# Patient Record
Sex: Male | Born: 1950 | Race: Black or African American | Hispanic: No | Marital: Single | Smoking: Former smoker
Health system: Southern US, Community
[De-identification: ages and names within clinical notes are randomized; demographics above are authoritative.]

## PROBLEM LIST (undated history)

## (undated) ENCOUNTER — Emergency Department: Payer: Self-pay | Source: Home / Self Care

## (undated) DIAGNOSIS — I10 Essential (primary) hypertension: Secondary | ICD-10-CM

## (undated) DIAGNOSIS — N289 Disorder of kidney and ureter, unspecified: Secondary | ICD-10-CM

## (undated) DIAGNOSIS — F0390 Unspecified dementia without behavioral disturbance: Secondary | ICD-10-CM

---

## 1998-01-03 ENCOUNTER — Emergency Department (HOSPITAL_COMMUNITY): Admission: EM | Admit: 1998-01-03 | Discharge: 1998-01-03 | Payer: Self-pay | Admitting: Emergency Medicine

## 1998-02-08 ENCOUNTER — Emergency Department (HOSPITAL_COMMUNITY): Admission: EM | Admit: 1998-02-08 | Discharge: 1998-02-08 | Payer: Self-pay | Admitting: Emergency Medicine

## 1998-04-25 ENCOUNTER — Emergency Department (HOSPITAL_COMMUNITY): Admission: EM | Admit: 1998-04-25 | Discharge: 1998-04-25 | Payer: Self-pay | Admitting: Emergency Medicine

## 1998-07-06 ENCOUNTER — Emergency Department (HOSPITAL_COMMUNITY): Admission: EM | Admit: 1998-07-06 | Discharge: 1998-07-06 | Payer: Self-pay | Admitting: Emergency Medicine

## 1998-08-01 ENCOUNTER — Emergency Department (HOSPITAL_COMMUNITY): Admission: EM | Admit: 1998-08-01 | Discharge: 1998-08-01 | Payer: Self-pay | Admitting: Emergency Medicine

## 1998-09-14 ENCOUNTER — Emergency Department (HOSPITAL_COMMUNITY): Admission: EM | Admit: 1998-09-14 | Discharge: 1998-09-14 | Payer: Self-pay | Admitting: Emergency Medicine

## 1998-10-17 ENCOUNTER — Emergency Department (HOSPITAL_COMMUNITY): Admission: EM | Admit: 1998-10-17 | Discharge: 1998-10-17 | Payer: Self-pay | Admitting: Emergency Medicine

## 1998-10-30 ENCOUNTER — Emergency Department (HOSPITAL_COMMUNITY): Admission: EM | Admit: 1998-10-30 | Discharge: 1998-10-30 | Payer: Self-pay | Admitting: Emergency Medicine

## 1998-11-14 ENCOUNTER — Emergency Department (HOSPITAL_COMMUNITY): Admission: EM | Admit: 1998-11-14 | Discharge: 1998-11-14 | Payer: Self-pay | Admitting: Emergency Medicine

## 1998-12-19 ENCOUNTER — Emergency Department (HOSPITAL_COMMUNITY): Admission: EM | Admit: 1998-12-19 | Discharge: 1998-12-19 | Payer: Self-pay | Admitting: *Deleted

## 1998-12-27 ENCOUNTER — Emergency Department (HOSPITAL_COMMUNITY): Admission: EM | Admit: 1998-12-27 | Discharge: 1998-12-27 | Payer: Self-pay | Admitting: Emergency Medicine

## 1998-12-27 ENCOUNTER — Encounter: Payer: Self-pay | Admitting: Emergency Medicine

## 1999-02-05 ENCOUNTER — Emergency Department (HOSPITAL_COMMUNITY): Admission: EM | Admit: 1999-02-05 | Discharge: 1999-02-05 | Payer: Self-pay | Admitting: Emergency Medicine

## 1999-05-04 ENCOUNTER — Encounter: Payer: Self-pay | Admitting: *Deleted

## 1999-05-04 ENCOUNTER — Emergency Department (HOSPITAL_COMMUNITY): Admission: EM | Admit: 1999-05-04 | Discharge: 1999-05-04 | Payer: Self-pay | Admitting: Emergency Medicine

## 1999-05-22 ENCOUNTER — Encounter: Payer: Self-pay | Admitting: Emergency Medicine

## 1999-05-22 ENCOUNTER — Emergency Department (HOSPITAL_COMMUNITY): Admission: EM | Admit: 1999-05-22 | Discharge: 1999-05-22 | Payer: Self-pay | Admitting: Emergency Medicine

## 1999-07-09 ENCOUNTER — Emergency Department (HOSPITAL_COMMUNITY): Admission: EM | Admit: 1999-07-09 | Discharge: 1999-07-09 | Payer: Self-pay | Admitting: Emergency Medicine

## 1999-08-18 ENCOUNTER — Encounter: Payer: Self-pay | Admitting: Emergency Medicine

## 1999-08-18 ENCOUNTER — Emergency Department (HOSPITAL_COMMUNITY): Admission: EM | Admit: 1999-08-18 | Discharge: 1999-08-18 | Payer: Self-pay | Admitting: Emergency Medicine

## 1999-09-11 ENCOUNTER — Emergency Department (HOSPITAL_COMMUNITY): Admission: EM | Admit: 1999-09-11 | Discharge: 1999-09-11 | Payer: Self-pay | Admitting: Emergency Medicine

## 1999-10-30 ENCOUNTER — Emergency Department (HOSPITAL_COMMUNITY): Admission: EM | Admit: 1999-10-30 | Discharge: 1999-10-30 | Payer: Self-pay | Admitting: *Deleted

## 1999-11-07 ENCOUNTER — Emergency Department (HOSPITAL_COMMUNITY): Admission: EM | Admit: 1999-11-07 | Discharge: 1999-11-07 | Payer: Self-pay | Admitting: Emergency Medicine

## 1999-11-26 ENCOUNTER — Encounter: Payer: Self-pay | Admitting: Internal Medicine

## 1999-11-26 ENCOUNTER — Inpatient Hospital Stay (HOSPITAL_COMMUNITY): Admission: EM | Admit: 1999-11-26 | Discharge: 1999-11-27 | Payer: Self-pay | Admitting: Emergency Medicine

## 1999-12-31 ENCOUNTER — Encounter: Payer: Self-pay | Admitting: Emergency Medicine

## 1999-12-31 ENCOUNTER — Emergency Department (HOSPITAL_COMMUNITY): Admission: EM | Admit: 1999-12-31 | Discharge: 1999-12-31 | Payer: Self-pay | Admitting: Emergency Medicine

## 2000-03-17 ENCOUNTER — Emergency Department (HOSPITAL_COMMUNITY): Admission: EM | Admit: 2000-03-17 | Discharge: 2000-03-17 | Payer: Self-pay | Admitting: Emergency Medicine

## 2000-04-28 ENCOUNTER — Emergency Department (HOSPITAL_COMMUNITY): Admission: EM | Admit: 2000-04-28 | Discharge: 2000-04-28 | Payer: Self-pay | Admitting: Emergency Medicine

## 2000-05-06 ENCOUNTER — Encounter: Payer: Self-pay | Admitting: Emergency Medicine

## 2000-05-06 ENCOUNTER — Emergency Department (HOSPITAL_COMMUNITY): Admission: EM | Admit: 2000-05-06 | Discharge: 2000-05-06 | Payer: Self-pay | Admitting: Emergency Medicine

## 2000-06-03 ENCOUNTER — Encounter: Payer: Self-pay | Admitting: *Deleted

## 2000-06-03 ENCOUNTER — Emergency Department (HOSPITAL_COMMUNITY): Admission: EM | Admit: 2000-06-03 | Discharge: 2000-06-03 | Payer: Self-pay | Admitting: *Deleted

## 2000-07-08 ENCOUNTER — Emergency Department (HOSPITAL_COMMUNITY): Admission: EM | Admit: 2000-07-08 | Discharge: 2000-07-08 | Payer: Self-pay | Admitting: Emergency Medicine

## 2000-07-19 ENCOUNTER — Emergency Department (HOSPITAL_COMMUNITY): Admission: EM | Admit: 2000-07-19 | Discharge: 2000-07-19 | Payer: Self-pay | Admitting: Emergency Medicine

## 2000-07-28 ENCOUNTER — Emergency Department (HOSPITAL_COMMUNITY): Admission: EM | Admit: 2000-07-28 | Discharge: 2000-07-28 | Payer: Self-pay | Admitting: Emergency Medicine

## 2000-07-28 ENCOUNTER — Encounter: Payer: Self-pay | Admitting: Emergency Medicine

## 2000-08-28 ENCOUNTER — Encounter: Payer: Self-pay | Admitting: Emergency Medicine

## 2000-08-28 ENCOUNTER — Inpatient Hospital Stay (HOSPITAL_COMMUNITY): Admission: EM | Admit: 2000-08-28 | Discharge: 2000-08-29 | Payer: Self-pay | Admitting: Emergency Medicine

## 2000-09-14 ENCOUNTER — Emergency Department (HOSPITAL_COMMUNITY): Admission: EM | Admit: 2000-09-14 | Discharge: 2000-09-14 | Payer: Self-pay | Admitting: Emergency Medicine

## 2000-09-15 ENCOUNTER — Emergency Department (HOSPITAL_COMMUNITY): Admission: EM | Admit: 2000-09-15 | Discharge: 2000-09-15 | Payer: Self-pay | Admitting: Emergency Medicine

## 2000-12-16 ENCOUNTER — Emergency Department (HOSPITAL_COMMUNITY): Admission: EM | Admit: 2000-12-16 | Discharge: 2000-12-16 | Payer: Self-pay | Admitting: Emergency Medicine

## 2001-01-16 ENCOUNTER — Emergency Department (HOSPITAL_COMMUNITY): Admission: EM | Admit: 2001-01-16 | Discharge: 2001-01-16 | Payer: Self-pay | Admitting: Emergency Medicine

## 2001-03-18 ENCOUNTER — Emergency Department (HOSPITAL_COMMUNITY): Admission: EM | Admit: 2001-03-18 | Discharge: 2001-03-18 | Payer: Self-pay | Admitting: Emergency Medicine

## 2001-05-23 ENCOUNTER — Encounter: Payer: Self-pay | Admitting: Emergency Medicine

## 2001-05-23 ENCOUNTER — Emergency Department (HOSPITAL_COMMUNITY): Admission: EM | Admit: 2001-05-23 | Discharge: 2001-05-23 | Payer: Self-pay | Admitting: Emergency Medicine

## 2001-05-28 ENCOUNTER — Emergency Department (HOSPITAL_COMMUNITY): Admission: EM | Admit: 2001-05-28 | Discharge: 2001-05-28 | Payer: Self-pay | Admitting: Emergency Medicine

## 2001-07-18 ENCOUNTER — Emergency Department (HOSPITAL_COMMUNITY): Admission: EM | Admit: 2001-07-18 | Discharge: 2001-07-18 | Payer: Self-pay | Admitting: Emergency Medicine

## 2001-08-01 ENCOUNTER — Emergency Department (HOSPITAL_COMMUNITY): Admission: EM | Admit: 2001-08-01 | Discharge: 2001-08-01 | Payer: Self-pay | Admitting: Emergency Medicine

## 2001-09-17 ENCOUNTER — Encounter: Payer: Self-pay | Admitting: Emergency Medicine

## 2001-09-17 ENCOUNTER — Emergency Department (HOSPITAL_COMMUNITY): Admission: EM | Admit: 2001-09-17 | Discharge: 2001-09-17 | Payer: Self-pay | Admitting: Emergency Medicine

## 2001-11-19 ENCOUNTER — Emergency Department (HOSPITAL_COMMUNITY): Admission: EM | Admit: 2001-11-19 | Discharge: 2001-11-19 | Payer: Self-pay | Admitting: Emergency Medicine

## 2001-11-19 ENCOUNTER — Encounter: Payer: Self-pay | Admitting: Emergency Medicine

## 2001-11-28 ENCOUNTER — Emergency Department (HOSPITAL_COMMUNITY): Admission: EM | Admit: 2001-11-28 | Discharge: 2001-11-28 | Payer: Self-pay | Admitting: Emergency Medicine

## 2001-12-23 ENCOUNTER — Emergency Department (HOSPITAL_COMMUNITY): Admission: EM | Admit: 2001-12-23 | Discharge: 2001-12-23 | Payer: Self-pay | Admitting: Emergency Medicine

## 2002-01-14 ENCOUNTER — Emergency Department (HOSPITAL_COMMUNITY): Admission: EM | Admit: 2002-01-14 | Discharge: 2002-01-14 | Payer: Self-pay

## 2002-02-13 ENCOUNTER — Emergency Department (HOSPITAL_COMMUNITY): Admission: EM | Admit: 2002-02-13 | Discharge: 2002-02-14 | Payer: Self-pay | Admitting: Emergency Medicine

## 2002-03-21 ENCOUNTER — Emergency Department (HOSPITAL_COMMUNITY): Admission: EM | Admit: 2002-03-21 | Discharge: 2002-03-21 | Payer: Self-pay

## 2002-07-22 ENCOUNTER — Emergency Department (HOSPITAL_COMMUNITY): Admission: EM | Admit: 2002-07-22 | Discharge: 2002-07-22 | Payer: Self-pay

## 2002-08-13 ENCOUNTER — Encounter: Payer: Self-pay | Admitting: Emergency Medicine

## 2002-08-13 ENCOUNTER — Emergency Department (HOSPITAL_COMMUNITY): Admission: EM | Admit: 2002-08-13 | Discharge: 2002-08-13 | Payer: Self-pay | Admitting: Emergency Medicine

## 2002-08-21 ENCOUNTER — Emergency Department (HOSPITAL_COMMUNITY): Admission: EM | Admit: 2002-08-21 | Discharge: 2002-08-21 | Payer: Self-pay | Admitting: Emergency Medicine

## 2002-08-29 ENCOUNTER — Emergency Department (HOSPITAL_COMMUNITY): Admission: EM | Admit: 2002-08-29 | Discharge: 2002-08-29 | Payer: Self-pay | Admitting: Emergency Medicine

## 2002-09-22 ENCOUNTER — Encounter: Payer: Self-pay | Admitting: Emergency Medicine

## 2002-09-22 ENCOUNTER — Emergency Department (HOSPITAL_COMMUNITY): Admission: EM | Admit: 2002-09-22 | Discharge: 2002-09-22 | Payer: Self-pay | Admitting: Emergency Medicine

## 2002-10-20 ENCOUNTER — Emergency Department (HOSPITAL_COMMUNITY): Admission: EM | Admit: 2002-10-20 | Discharge: 2002-10-20 | Payer: Self-pay | Admitting: Emergency Medicine

## 2002-11-20 ENCOUNTER — Emergency Department (HOSPITAL_COMMUNITY): Admission: EM | Admit: 2002-11-20 | Discharge: 2002-11-20 | Payer: Self-pay

## 2003-03-07 ENCOUNTER — Emergency Department (HOSPITAL_COMMUNITY): Admission: EM | Admit: 2003-03-07 | Discharge: 2003-03-07 | Payer: Self-pay | Admitting: Emergency Medicine

## 2003-03-07 ENCOUNTER — Encounter: Payer: Self-pay | Admitting: Emergency Medicine

## 2003-03-29 ENCOUNTER — Emergency Department (HOSPITAL_COMMUNITY): Admission: EM | Admit: 2003-03-29 | Discharge: 2003-03-29 | Payer: Self-pay | Admitting: Emergency Medicine

## 2003-05-24 ENCOUNTER — Emergency Department (HOSPITAL_COMMUNITY): Admission: EM | Admit: 2003-05-24 | Discharge: 2003-05-24 | Payer: Self-pay | Admitting: Emergency Medicine

## 2003-06-05 ENCOUNTER — Emergency Department (HOSPITAL_COMMUNITY): Admission: AD | Admit: 2003-06-05 | Discharge: 2003-06-05 | Payer: Self-pay | Admitting: Emergency Medicine

## 2003-06-05 ENCOUNTER — Emergency Department (HOSPITAL_COMMUNITY): Admission: EM | Admit: 2003-06-05 | Discharge: 2003-06-06 | Payer: Self-pay

## 2003-11-01 ENCOUNTER — Emergency Department (HOSPITAL_COMMUNITY): Admission: EM | Admit: 2003-11-01 | Discharge: 2003-11-02 | Payer: Self-pay | Admitting: Emergency Medicine

## 2003-11-24 ENCOUNTER — Emergency Department (HOSPITAL_COMMUNITY): Admission: EM | Admit: 2003-11-24 | Discharge: 2003-11-24 | Payer: Self-pay | Admitting: Emergency Medicine

## 2003-12-02 ENCOUNTER — Emergency Department (HOSPITAL_COMMUNITY): Admission: EM | Admit: 2003-12-02 | Discharge: 2003-12-02 | Payer: Self-pay | Admitting: Emergency Medicine

## 2003-12-29 ENCOUNTER — Emergency Department (HOSPITAL_COMMUNITY): Admission: EM | Admit: 2003-12-29 | Discharge: 2003-12-29 | Payer: Self-pay | Admitting: Emergency Medicine

## 2004-01-22 ENCOUNTER — Emergency Department (HOSPITAL_COMMUNITY): Admission: EM | Admit: 2004-01-22 | Discharge: 2004-01-22 | Payer: Self-pay | Admitting: Emergency Medicine

## 2004-01-31 ENCOUNTER — Emergency Department (HOSPITAL_COMMUNITY): Admission: EM | Admit: 2004-01-31 | Discharge: 2004-01-31 | Payer: Self-pay | Admitting: Emergency Medicine

## 2004-02-20 ENCOUNTER — Emergency Department (HOSPITAL_COMMUNITY): Admission: EM | Admit: 2004-02-20 | Discharge: 2004-02-20 | Payer: Self-pay | Admitting: Emergency Medicine

## 2004-03-25 ENCOUNTER — Emergency Department (HOSPITAL_COMMUNITY): Admission: EM | Admit: 2004-03-25 | Discharge: 2004-03-25 | Payer: Self-pay

## 2004-04-16 ENCOUNTER — Emergency Department (HOSPITAL_COMMUNITY): Admission: EM | Admit: 2004-04-16 | Discharge: 2004-04-16 | Payer: Self-pay | Admitting: Emergency Medicine

## 2004-06-11 ENCOUNTER — Emergency Department (HOSPITAL_COMMUNITY): Admission: EM | Admit: 2004-06-11 | Discharge: 2004-06-11 | Payer: Self-pay | Admitting: Emergency Medicine

## 2004-08-09 ENCOUNTER — Emergency Department (HOSPITAL_COMMUNITY): Admission: EM | Admit: 2004-08-09 | Discharge: 2004-08-09 | Payer: Self-pay | Admitting: Emergency Medicine

## 2004-09-09 ENCOUNTER — Emergency Department (HOSPITAL_COMMUNITY): Admission: EM | Admit: 2004-09-09 | Discharge: 2004-09-09 | Payer: Self-pay | Admitting: Emergency Medicine

## 2004-09-17 ENCOUNTER — Emergency Department (HOSPITAL_COMMUNITY): Admission: EM | Admit: 2004-09-17 | Discharge: 2004-09-17 | Payer: Self-pay | Admitting: Emergency Medicine

## 2004-10-08 ENCOUNTER — Emergency Department (HOSPITAL_COMMUNITY): Admission: EM | Admit: 2004-10-08 | Discharge: 2004-10-08 | Payer: Self-pay | Admitting: Emergency Medicine

## 2004-10-30 ENCOUNTER — Ambulatory Visit: Payer: Self-pay | Admitting: Internal Medicine

## 2004-10-30 ENCOUNTER — Inpatient Hospital Stay (HOSPITAL_COMMUNITY): Admission: EM | Admit: 2004-10-30 | Discharge: 2004-11-02 | Payer: Self-pay | Admitting: Emergency Medicine

## 2004-11-18 ENCOUNTER — Emergency Department (HOSPITAL_COMMUNITY): Admission: EM | Admit: 2004-11-18 | Discharge: 2004-11-18 | Payer: Self-pay | Admitting: Emergency Medicine

## 2005-03-11 ENCOUNTER — Emergency Department (HOSPITAL_COMMUNITY): Admission: EM | Admit: 2005-03-11 | Discharge: 2005-03-11 | Payer: Self-pay | Admitting: Emergency Medicine

## 2005-03-12 ENCOUNTER — Inpatient Hospital Stay (HOSPITAL_COMMUNITY): Admission: EM | Admit: 2005-03-12 | Discharge: 2005-03-15 | Payer: Self-pay | Admitting: Emergency Medicine

## 2005-04-14 ENCOUNTER — Emergency Department (HOSPITAL_COMMUNITY): Admission: EM | Admit: 2005-04-14 | Discharge: 2005-04-14 | Payer: Self-pay | Admitting: Emergency Medicine

## 2005-05-04 ENCOUNTER — Emergency Department (HOSPITAL_COMMUNITY): Admission: EM | Admit: 2005-05-04 | Discharge: 2005-05-04 | Payer: Self-pay | Admitting: Emergency Medicine

## 2005-08-01 ENCOUNTER — Emergency Department (HOSPITAL_COMMUNITY): Admission: EM | Admit: 2005-08-01 | Discharge: 2005-08-01 | Payer: Self-pay | Admitting: Emergency Medicine

## 2005-08-11 ENCOUNTER — Emergency Department (HOSPITAL_COMMUNITY): Admission: EM | Admit: 2005-08-11 | Discharge: 2005-08-11 | Payer: Self-pay | Admitting: Emergency Medicine

## 2005-09-11 ENCOUNTER — Emergency Department (HOSPITAL_COMMUNITY): Admission: EM | Admit: 2005-09-11 | Discharge: 2005-09-12 | Payer: Self-pay | Admitting: Emergency Medicine

## 2005-10-05 ENCOUNTER — Emergency Department (HOSPITAL_COMMUNITY): Admission: EM | Admit: 2005-10-05 | Discharge: 2005-10-05 | Payer: Self-pay | Admitting: *Deleted

## 2005-12-03 ENCOUNTER — Emergency Department (HOSPITAL_COMMUNITY): Admission: EM | Admit: 2005-12-03 | Discharge: 2005-12-03 | Payer: Self-pay | Admitting: Emergency Medicine

## 2006-04-18 ENCOUNTER — Emergency Department (HOSPITAL_COMMUNITY): Admission: EM | Admit: 2006-04-18 | Discharge: 2006-04-18 | Payer: Self-pay | Admitting: Emergency Medicine

## 2006-06-03 ENCOUNTER — Emergency Department (HOSPITAL_COMMUNITY): Admission: EM | Admit: 2006-06-03 | Discharge: 2006-06-04 | Payer: Self-pay | Admitting: Emergency Medicine

## 2006-06-17 ENCOUNTER — Emergency Department (HOSPITAL_COMMUNITY): Admission: EM | Admit: 2006-06-17 | Discharge: 2006-06-17 | Payer: Self-pay | Admitting: Emergency Medicine

## 2006-07-13 ENCOUNTER — Emergency Department (HOSPITAL_COMMUNITY): Admission: EM | Admit: 2006-07-13 | Discharge: 2006-07-13 | Payer: Self-pay | Admitting: Emergency Medicine

## 2006-07-23 ENCOUNTER — Emergency Department (HOSPITAL_COMMUNITY): Admission: EM | Admit: 2006-07-23 | Discharge: 2006-07-24 | Payer: Self-pay | Admitting: Emergency Medicine

## 2006-10-18 ENCOUNTER — Emergency Department (HOSPITAL_COMMUNITY): Admission: EM | Admit: 2006-10-18 | Discharge: 2006-10-18 | Payer: Self-pay | Admitting: Anesthesiology

## 2006-11-09 ENCOUNTER — Inpatient Hospital Stay (HOSPITAL_COMMUNITY): Admission: EM | Admit: 2006-11-09 | Discharge: 2006-11-11 | Payer: Self-pay | Admitting: Emergency Medicine

## 2006-11-30 ENCOUNTER — Emergency Department (HOSPITAL_COMMUNITY): Admission: EM | Admit: 2006-11-30 | Discharge: 2006-11-30 | Payer: Self-pay | Admitting: Emergency Medicine

## 2006-12-11 ENCOUNTER — Emergency Department (HOSPITAL_COMMUNITY): Admission: EM | Admit: 2006-12-11 | Discharge: 2006-12-11 | Payer: Self-pay | Admitting: Emergency Medicine

## 2006-12-20 ENCOUNTER — Emergency Department (HOSPITAL_COMMUNITY): Admission: EM | Admit: 2006-12-20 | Discharge: 2006-12-20 | Payer: Self-pay | Admitting: Emergency Medicine

## 2007-01-18 ENCOUNTER — Emergency Department (HOSPITAL_COMMUNITY): Admission: EM | Admit: 2007-01-18 | Discharge: 2007-01-18 | Payer: Self-pay | Admitting: Emergency Medicine

## 2007-03-09 ENCOUNTER — Emergency Department (HOSPITAL_COMMUNITY): Admission: EM | Admit: 2007-03-09 | Discharge: 2007-03-09 | Payer: Self-pay | Admitting: Emergency Medicine

## 2007-06-23 ENCOUNTER — Emergency Department (HOSPITAL_COMMUNITY): Admission: EM | Admit: 2007-06-23 | Discharge: 2007-06-23 | Payer: Self-pay | Admitting: Emergency Medicine

## 2007-07-02 ENCOUNTER — Inpatient Hospital Stay (HOSPITAL_COMMUNITY): Admission: EM | Admit: 2007-07-02 | Discharge: 2007-07-23 | Payer: Self-pay | Admitting: Emergency Medicine

## 2008-10-12 IMAGING — CT CT HEAD W/O CM
1 of 2 series · 13 of 30 positions shown, 17 images · IV contrast (agent unspecified)
Comparison: 07/03/07.

CLINICAL DATA: Follow-up intracranial hemorrhage. 
HEAD CT WITHOUT CONTRAST:
TECHNIQUE: Contiguous axial images were obtained from the base of the skull through the vertex according to standard protocol without contrast.

[Series 2: brain · axial · 0.47mm/px · z∈[+101,+231]mm · 13 of 32 slices shown, 17 images]
[im 3/32  brain]
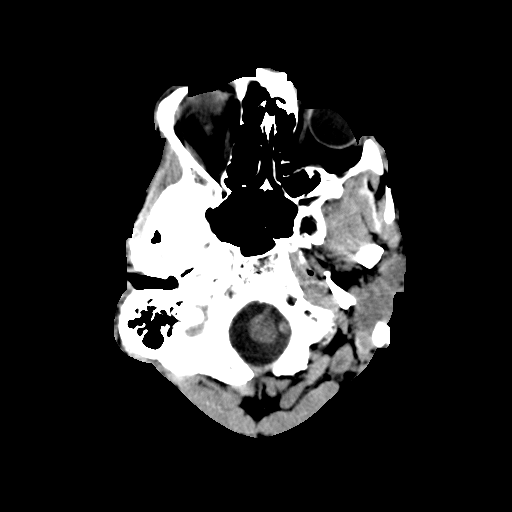
[im 3/32  bone]
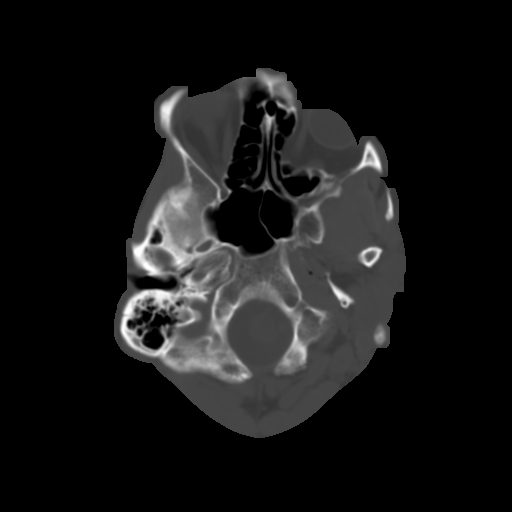
[im 5/32  brain]
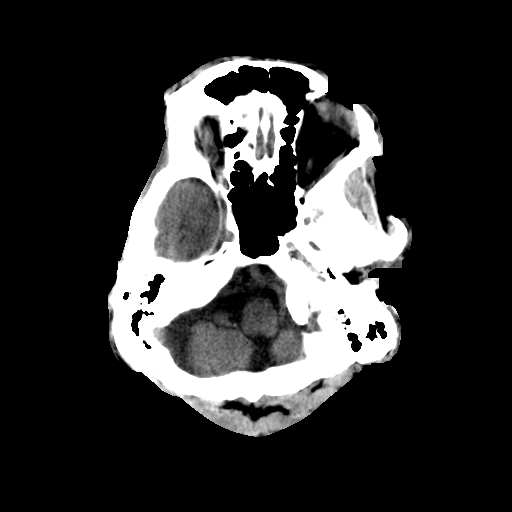
[im 7/32  brain]
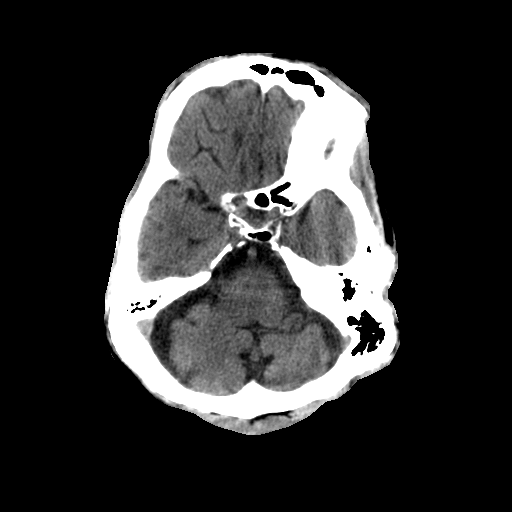
[im 9/32  brain]
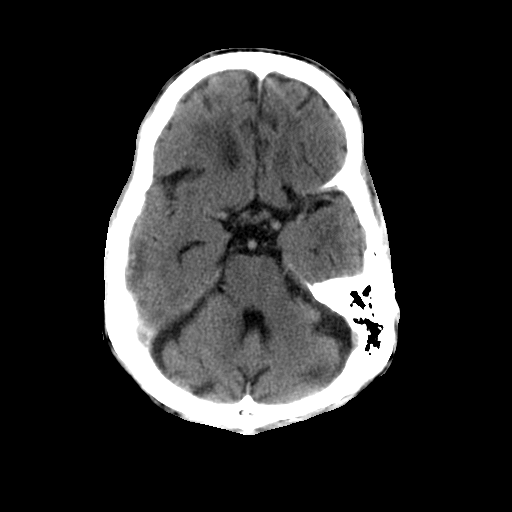
[im 12/32  brain]
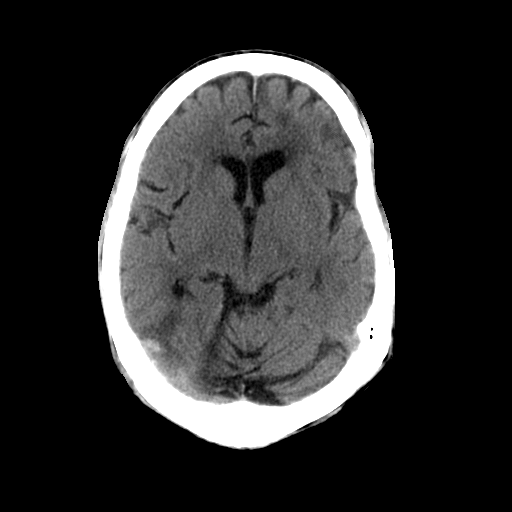
[im 12/32  bone]
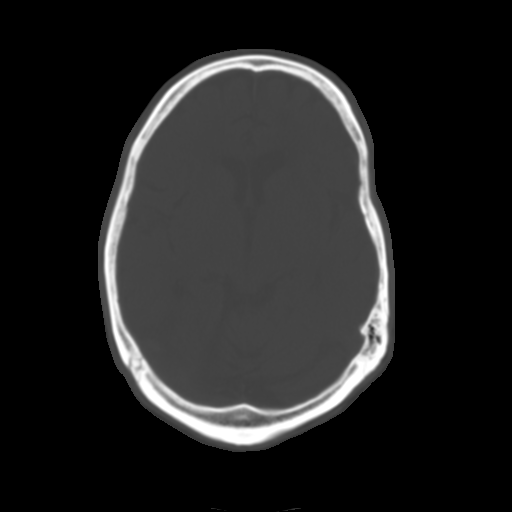
[im 14/32  brain]
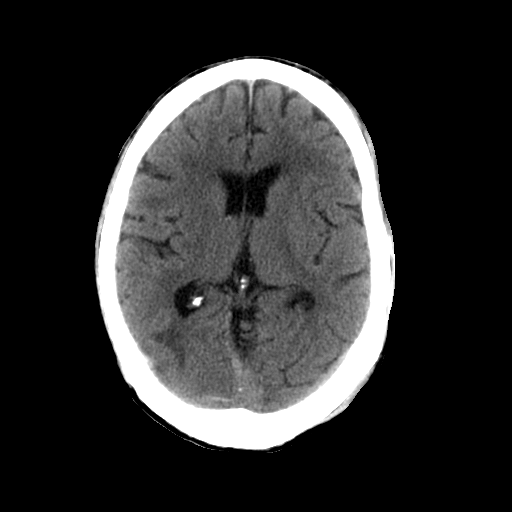
[im 16/32  brain]
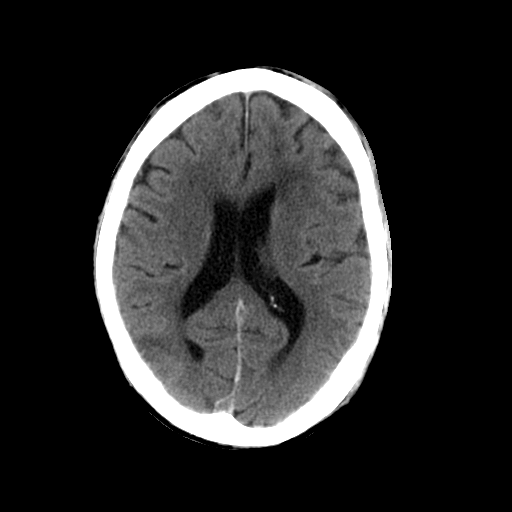
[im 18/32  brain]
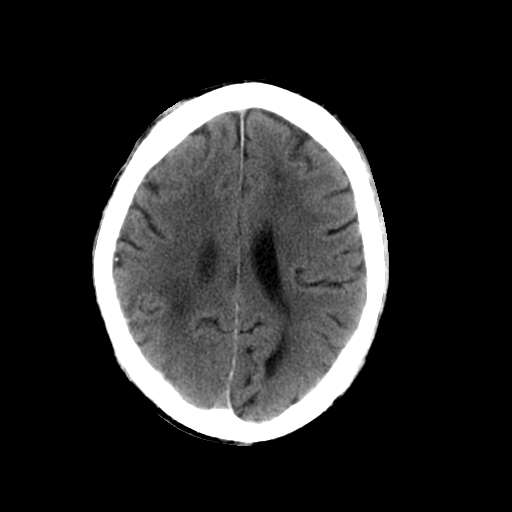
[im 20/32  brain]
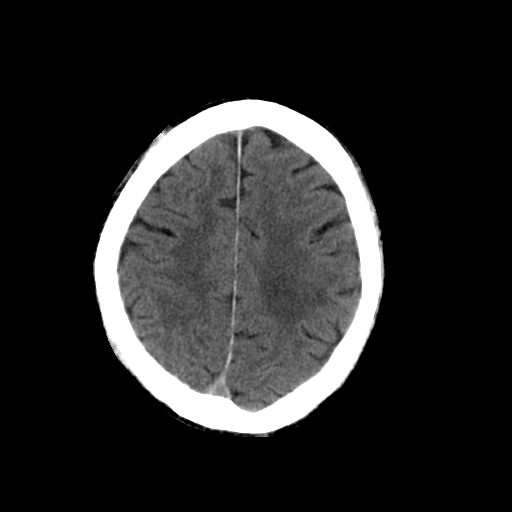
[im 20/32  bone]
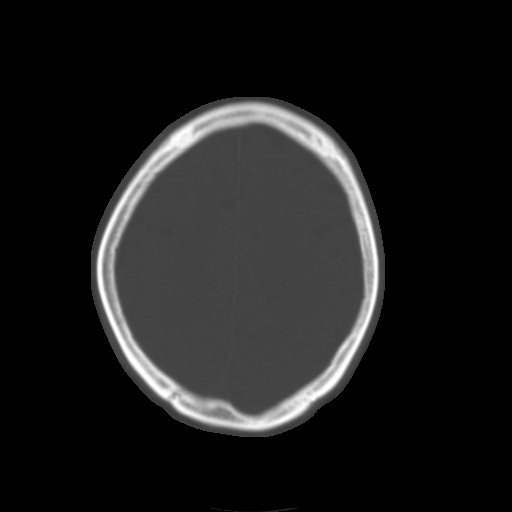
[im 23/32  brain]
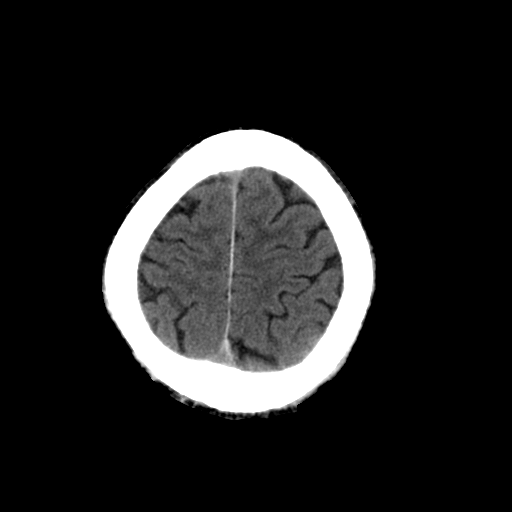
[im 25/32  brain]
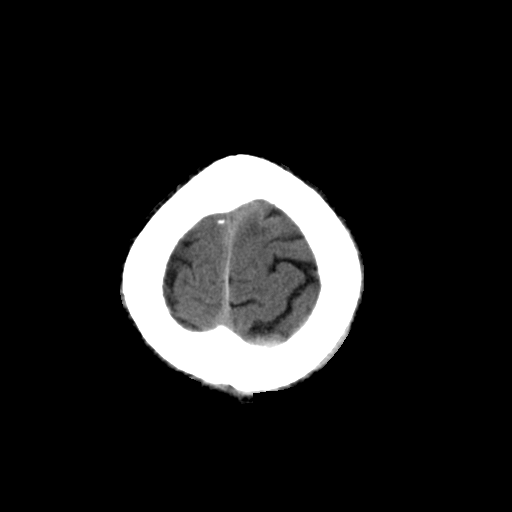
[im 27/32  brain]
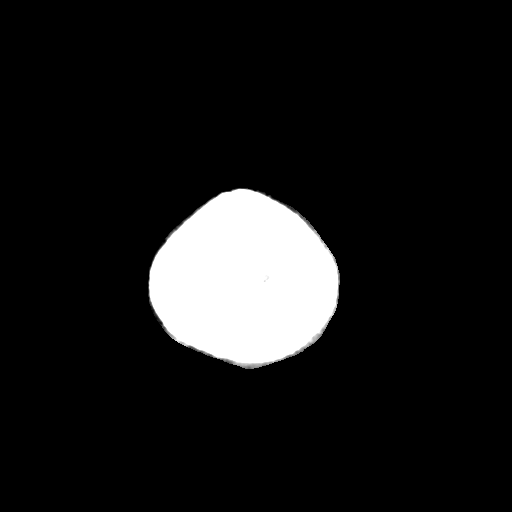
[im 29/32  brain]
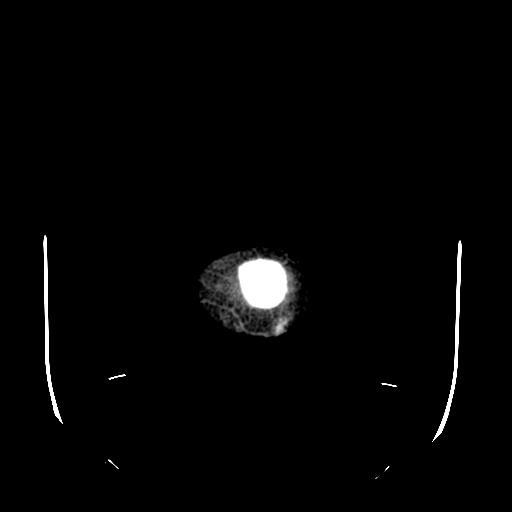
[im 29/32  bone]
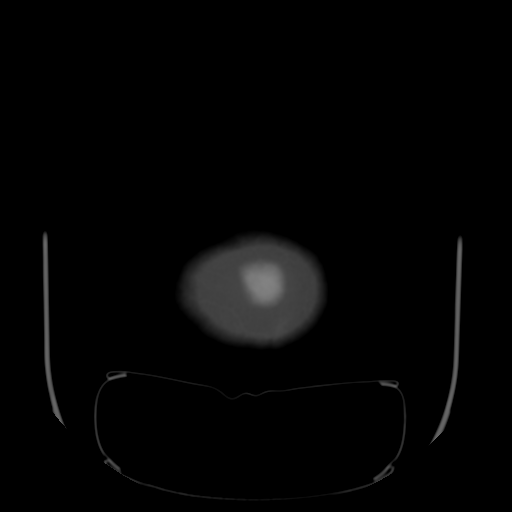

[13 of 30 positions shown; findings below may reference images not displayed]

FINDINGS: The hematoma seen along the junction of the posterior right temporal, occipital, and parietal lobe with surrounding edema has overall similar dimensions to that on prior exam.  There is also small amount of residual subarachnoid within the right parietal region.  The left superior parietal subdural hematoma is visualized although less conspicuous than on the prior exam.  Calvarial fracture extends from high left parietal region across midline towards the right lambdoid suture.  Atrophy and small vessel disease type changes remain.  Old left zygomatic arch fracture.
IMPRESSION: The left superior parietal subdural hematoma is less well delineated on the present exam and may have partially resolved with the remainder of findings similar to that on prior exam.

## 2008-10-14 IMAGING — CR DG CERVICAL SPINE FLEX&EXT ONLY
3 series · 3 of 3 positions shown · non-contrast
Comparison: CT cervical spine 07/02/07.

CLINICAL DATA: 56-year-old male with history of trauma, subarachnoid hemorrhage, status-post fall. 
 CERVICAL SPINE FLEXION AND EXTENSION SERIES ? 3 VIEW:

[w c-spine lat * (1 of 2)]
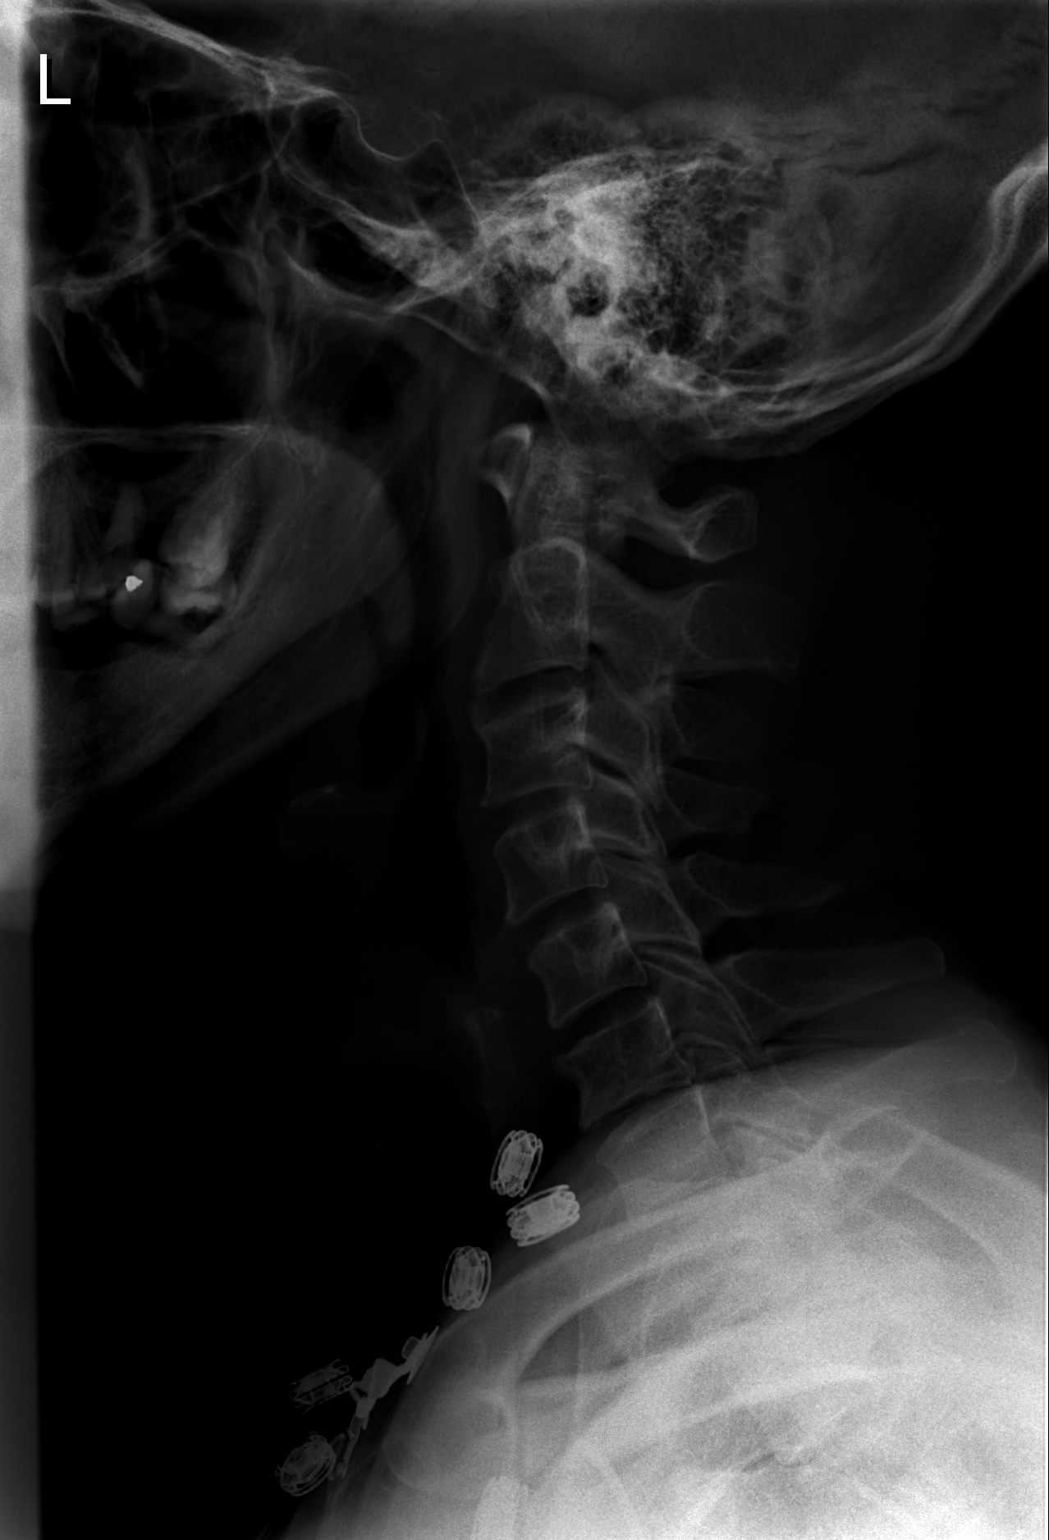

[w c-spine lat * (2 of 2)]
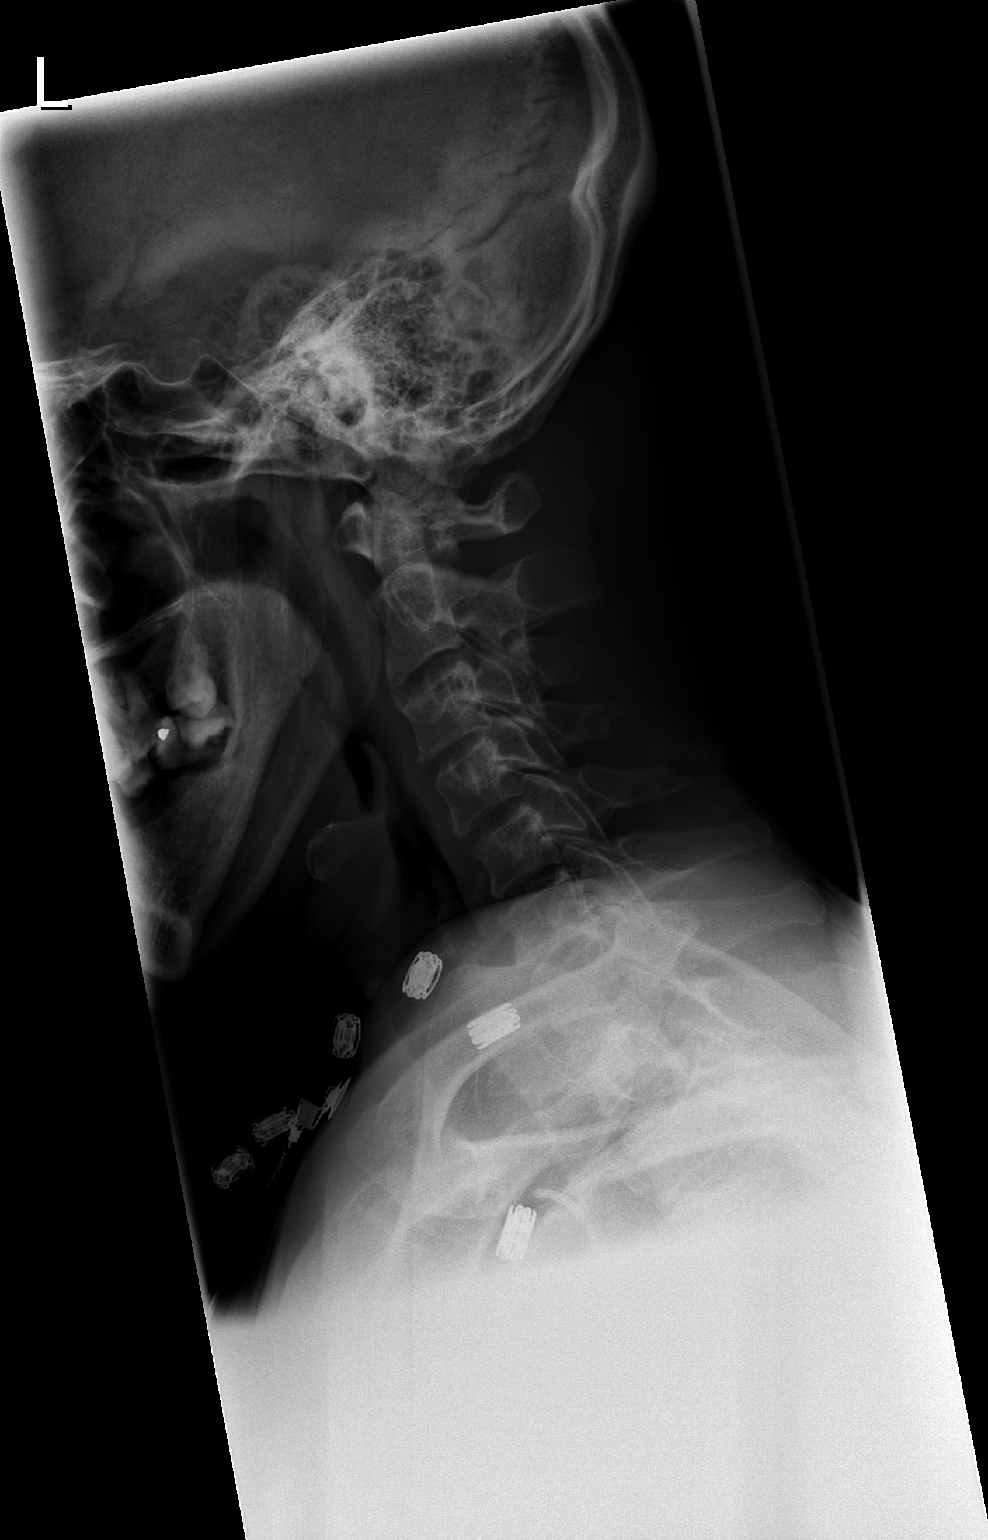

[w c-spine lat]
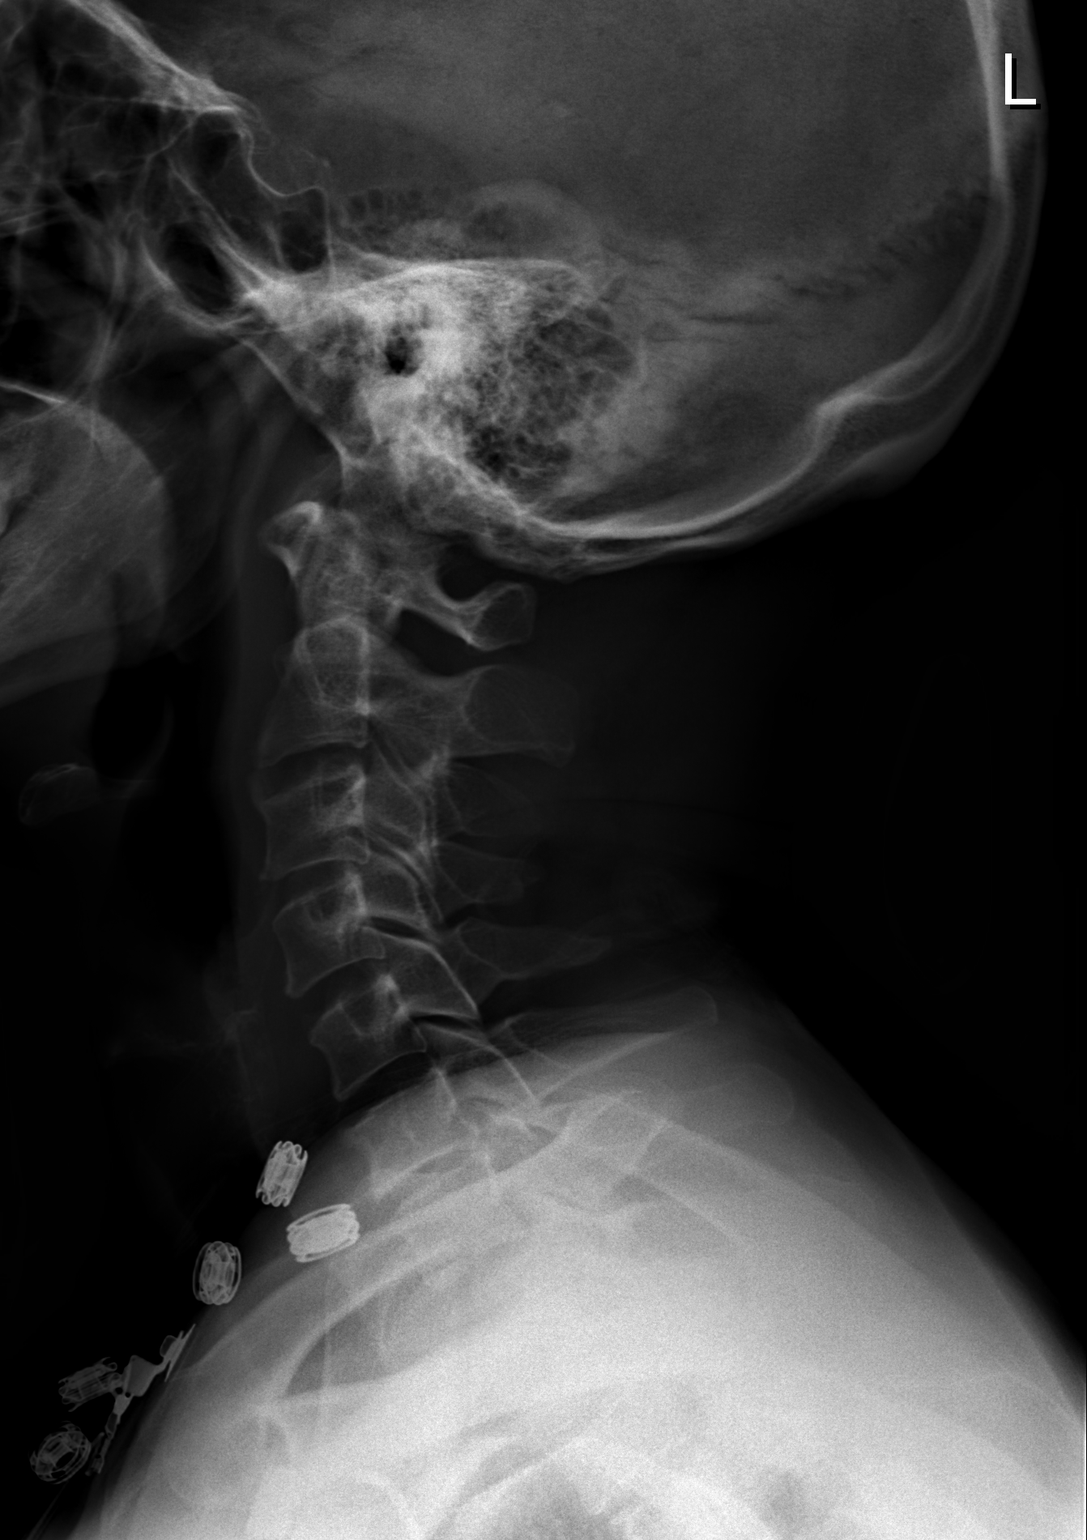

[3 of 3 positions shown; findings below may reference images not displayed]

FINDINGS: The neutral view demonstrates grade I retrolisthesis of C-3 on C-4 and C-3 end plate spurring, stable from prior CT.  Incidental ossification of the posterior longitudinal ligament at the C-3 level was demonstrated on that study.  There is otherwise straightening of cervical lordosis.  Cervicothoracic junction alignment appears normal.  Prevertebral soft tissues are within normal limits.  With flexion there is limited range of motion.  There is maintained alignment at the cervicothoracic junction.  The C3-C4 spondylolisthesis is slightly less pronounced.  No abnormal motion is identified.  With extension, there is also decreased range of motion.  Stable C3-C4 spondylolisthesis and cervical thoracic junction alignment.
IMPRESSION: 1.  Limited range of motion in both flexion and extension. 
 2.  Stable grade I retrolisthesis of C-3 on C-4. 
 3.  No translational instability identified.

## 2009-02-21 ENCOUNTER — Emergency Department (HOSPITAL_BASED_OUTPATIENT_CLINIC_OR_DEPARTMENT_OTHER): Admission: EM | Admit: 2009-02-21 | Discharge: 2009-02-21 | Payer: Self-pay | Admitting: Emergency Medicine

## 2009-04-19 ENCOUNTER — Emergency Department (HOSPITAL_COMMUNITY): Admission: EM | Admit: 2009-04-19 | Discharge: 2009-04-19 | Payer: Self-pay | Admitting: Emergency Medicine

## 2009-05-09 ENCOUNTER — Inpatient Hospital Stay (HOSPITAL_COMMUNITY): Admission: EM | Admit: 2009-05-09 | Discharge: 2009-05-13 | Payer: Self-pay | Admitting: Emergency Medicine

## 2010-12-23 LAB — PHENYTOIN LEVEL, TOTAL
Phenytoin Lvl: 24 ug/mL — ABNORMAL HIGH (ref 10.0–20.0)
Phenytoin Lvl: 24.4 ug/mL — ABNORMAL HIGH (ref 10.0–20.0)

## 2010-12-23 LAB — COMPREHENSIVE METABOLIC PANEL
CO2: 17 mEq/L — ABNORMAL LOW (ref 19–32)
Calcium: 9.4 mg/dL (ref 8.4–10.5)
Chloride: 110 mEq/L (ref 96–112)
GFR calc non Af Amer: >60 mL/min (ref >60)
Potassium: 3.2 mEq/L — ABNORMAL LOW (ref 3.5–5.1)
Sodium: 143 mEq/L (ref 135–145)

## 2010-12-23 LAB — BASIC METABOLIC PANEL
BUN: 7 mg/dL (ref 6–23)
Chloride: 107 mEq/L (ref 96–112)
Creatinine, Ser: 1.07 mg/dL (ref 0.4–1.5)
Glucose, Bld: 95 mg/dL (ref 70–99)

## 2010-12-23 LAB — TSH: TSH: 1.067 u[IU]/mL (ref 0.350–4.500)

## 2010-12-23 LAB — CBC
HCT: 34.2 % — ABNORMAL LOW (ref 39.0–52.0)
HCT: 35 % — ABNORMAL LOW (ref 39.0–52.0)
Hemoglobin: 11.2 g/dL — ABNORMAL LOW (ref 13.0–17.0)
Hemoglobin: 11.7 g/dL — ABNORMAL LOW (ref 13.0–17.0)
MCHC: 32.5 g/dL (ref 30.0–36.0)
MCHC: 32.6 g/dL (ref 30.0–36.0)
MCHC: 32.8 g/dL (ref 30.0–36.0)
MCV: 96.8 fL (ref 78.0–100.0)
MCV: 97.6 fL (ref 78.0–100.0)
Platelets: 127 10*3/uL — ABNORMAL LOW (ref 150–400)
Platelets: 130 10*3/uL — ABNORMAL LOW (ref 150–400)
RBC: 3.53 MIL/uL — ABNORMAL LOW (ref 4.22–5.81)
RBC: 3.59 MIL/uL — ABNORMAL LOW (ref 4.22–5.81)
RDW: 17.2 % — ABNORMAL HIGH (ref 11.5–15.5)
RDW: 17.2 % — ABNORMAL HIGH (ref 11.5–15.5)
RDW: 17.7 % — ABNORMAL HIGH (ref 11.5–15.5)
WBC: 10.9 10*3/uL — ABNORMAL HIGH (ref 4.0–10.5)
WBC: 7.9 10*3/uL (ref 4.0–10.5)

## 2010-12-23 LAB — DIFFERENTIAL
Basophils Absolute: 0 10*3/uL (ref 0.0–0.1)
Basophils Relative: 0 % (ref 0–1)
Eosinophils Relative: 2 % (ref 0–5)
Lymphocytes Relative: 30 % (ref 12–46)
Lymphs Abs: 3.3 10*3/uL (ref 0.7–4.0)
Monocytes Relative: 7 % (ref 3–12)
Neutrophils Relative %: 62 % (ref 43–77)

## 2010-12-23 LAB — RAPID URINE DRUG SCREEN, HOSP PERFORMED
Amphetamines: NOT DETECTED
Barbiturates: NOT DETECTED
Benzodiazepines: NOT DETECTED
Cocaine: NOT DETECTED
Opiates: NOT DETECTED
Tetrahydrocannabinol: NOT DETECTED

## 2010-12-23 LAB — ETHANOL: Alcohol, Ethyl (B): <5 mg/dL (ref 0–10)

## 2010-12-24 LAB — URINALYSIS, ROUTINE W REFLEX MICROSCOPIC
Bilirubin Urine: NEGATIVE
Ketones, ur: NEGATIVE mg/dL
Nitrite: NEGATIVE
Protein, ur: NEGATIVE mg/dL

## 2010-12-24 LAB — COMPREHENSIVE METABOLIC PANEL
ALT: 14 U/L (ref 0–53)
AST: 26 U/L (ref 0–37)
Alkaline Phosphatase: 85 U/L (ref 39–117)
CO2: 24 mEq/L (ref 19–32)
Calcium: 8.5 mg/dL (ref 8.4–10.5)
GFR calc Af Amer: >60 mL/min (ref >60)
Glucose, Bld: 118 mg/dL — ABNORMAL HIGH (ref 70–99)
Potassium: 3.8 mEq/L (ref 3.5–5.1)
Sodium: 140 mEq/L (ref 135–145)
Total Protein: 6.1 g/dL (ref 6.0–8.3)

## 2010-12-24 LAB — DIFFERENTIAL
Basophils Relative: 0 % (ref 0–1)
Eosinophils Absolute: 0.1 10*3/uL (ref 0.0–0.7)
Eosinophils Relative: 1 % (ref 0–5)
Lymphs Abs: 0.9 10*3/uL (ref 0.7–4.0)
Monocytes Relative: 5 % (ref 3–12)
Neutrophils Relative %: 73 % (ref 43–77)

## 2010-12-24 LAB — TRICYCLICS SCREEN, URINE: TCA Scrn: NOT DETECTED

## 2010-12-24 LAB — CBC
Hemoglobin: 10.4 g/dL — ABNORMAL LOW (ref 13.0–17.0)
MCHC: 34.1 g/dL (ref 30.0–36.0)
RBC: 3.27 MIL/uL — ABNORMAL LOW (ref 4.22–5.81)
RDW: 15.5 % (ref 11.5–15.5)

## 2010-12-24 LAB — RAPID URINE DRUG SCREEN, HOSP PERFORMED: Tetrahydrocannabinol: NOT DETECTED

## 2010-12-24 LAB — ETHANOL: Alcohol, Ethyl (B): <5 mg/dL (ref 0–10)

## 2010-12-25 LAB — DIFFERENTIAL
Basophils Absolute: 0 10*3/uL (ref 0.0–0.1)
Basophils Relative: 1 % (ref 0–1)
Eosinophils Absolute: 0 10*3/uL (ref 0.0–0.7)
Monocytes Relative: 9 % (ref 3–12)
Neutro Abs: 5 10*3/uL (ref 1.7–7.7)
Neutrophils Relative %: 63 % (ref 43–77)

## 2010-12-25 LAB — CBC
MCHC: 34.3 g/dL (ref 30.0–36.0)
MCV: 92.6 fL (ref 78.0–100.0)
Platelets: 121 10*3/uL — ABNORMAL LOW (ref 150–400)
RDW: 13.6 % (ref 11.5–15.5)

## 2010-12-25 LAB — BASIC METABOLIC PANEL
BUN: 14 mg/dL (ref 6–23)
CO2: 28 mEq/L (ref 19–32)
Calcium: 9.8 mg/dL (ref 8.4–10.5)
Chloride: 107 mEq/L (ref 96–112)
Creatinine, Ser: 1.1 mg/dL (ref 0.4–1.5)
Glucose, Bld: 115 mg/dL — ABNORMAL HIGH (ref 70–99)

## 2010-12-25 LAB — URINE CULTURE
Colony Count: NO GROWTH
Culture: NO GROWTH

## 2010-12-25 LAB — URINALYSIS, ROUTINE W REFLEX MICROSCOPIC
Hgb urine dipstick: NEGATIVE
Nitrite: NEGATIVE
Protein, ur: 100 mg/dL — AB
Urobilinogen, UA: 1 mg/dL (ref 0.0–1.0)

## 2010-12-25 LAB — URINE MICROSCOPIC-ADD ON

## 2011-01-30 NOTE — Discharge Summary (Signed)
Alex Waters, Alex Waters NO.:  [id number]   MEDICAL RECORD NO.:  [id number]          PATIENT TYPE:  INP   LOCATION:  3027                         FACILITY:  [facility]   PHYSICIAN:  Ladell Pier, M.D.   DATE OF BIRTH:  July 14, 1951   DATE OF ADMISSION:  07/02/2007  DATE OF DISCHARGE:                               DISCHARGE SUMMARY   ADDENDUM:   This is for the period of July 15, 2007 until July 20, 2007.  The  patient really had no significant change within that time period.  He is  basically here waiting for placement.  He has chronic hyponatremia, and  that has been stable.  With the last CMP done on July 17, 2007, his  sodium was 121.  It has been running between 118 and 120-121.  Cortisol  level has been normal at 15.3.  Dilantin level was 22.7 on July 15, 2007, and that should be redone within 7 days of July 15, 2007.  Otherwise the patient has been stable, with no new events.  He is  basically waiting for placement.      Ladell Pier, M.D.  Electronically Signed     NJ/MEDQ  D:  07/20/2007  T:  07/21/2007  Job:  [id number]

## 2011-01-30 NOTE — Discharge Summary (Signed)
Alex Waters, Alex Waters                ACCOUNT NO.:  [id number]   MEDICAL RECORD NO.:  [id number]          PATIENT TYPE:  INP   LOCATION:  3027                         FACILITY:  [facility]   PHYSICIAN:  Ladell Pier, M.D.   DATE OF BIRTH:  Dec 16, 1950   DATE OF ADMISSION:  07/02/2007  DATE OF DISCHARGE:                               DISCHARGE SUMMARY   ADDENDUM:   MEDICATIONS:  No changes from the second to today:   1. Nicotine patch, Nicoderm CQ 7 mg q.24h. daily.  2. Protonix 40 mg daily.  3. Thiamine 100 mg daily.  4. Amlodipine 10 mg daily.  5. Folic acid 1 mg daily.  6. Clonidine 0.1 mg twice daily.  7. Demeclocycline 300 mg three times daily.  8. Lotensin 20 mg daily.  9. Dilantin 100 mg at bedtime.      Ladell Pier, M.D.  Electronically Signed     NJ/MEDQ  D:  07/22/2007  T:  07/22/2007  Job:  [id number]

## 2011-01-30 NOTE — Consult Note (Signed)
NAMEOTONIEL, MYHAND NO.:  [id number]   MEDICAL RECORD NO.:  [id number]          PATIENT TYPE:  INP   LOCATION:  1401                         FACILITY:  [facility]   PHYSICIAN:  Antonietta Breach, M.D.  DATE OF BIRTH:  December 27, 1950   DATE OF CONSULTATION:  05/12/2009  DATE OF DISCHARGE:                                 CONSULTATION   Mr. Whalley has continued with difficulty with orientation.  He also has  impairment in his ability to appreciate the necessary environmental  support for him.  His mood is within normal limits.  Also his appetite  is intact.  He is not having any hallucinations or delusions.  He is non-  combative and is cooperative with care.   LABORATORY DATA:  WBC 6.7, hemoglobin 11.7, platelet count 127,000.   EXAMINATION:  VITAL SIGNS:  Temperature 98.2, pulse 60, respiratory rate  18, blood pressure 118/78, O2 saturation room air 100%.   MENTAL STATUS EXAM:  Mr. Schuld is lying in a right lateral decubitus  position in his hospital bed with no abnormal involuntary movements.  His eye contact is poor.  However, his attention span is only mildly  decreased.  His affect is mildly flat.  His mood is within normal  limits.  His concentration is mildly impaired on orientation testing.  He believes that the year is 2002.  He is oriented to person.  He  believes that he is in a rest home when he is actually in [facility]   His memory is impaired.  His fund of knowledge and intelligence are  below that of his estimated pre morbid baseline.  His speech does  involve normal rate.  There is no dysarthria.  The prosody is mildly  flat.  Thought process is coherent.  Thought content no thoughts of  harming himself or others no delusions or hallucinations.  His insight  is poor.  His judgment is impaired.   ASSESSMENT:  AXIS I:  1. 293.00 delirium not otherwise specified.  He did have a delirium      superimposed on his dementia, which appears to  have been      multifactorial, possibly involving a thiamine deficiency component      as well as a postictal component.  2. Dementia not otherwise specified.  3. Mr. Chauvin decapacitating diagnosis is dementia not otherwise      specified.   RECOMMENDATIONS:  Would continue his thiamine 100 mg daily indefinitely.      Antonietta Breach, M.D.  Electronically Signed     JW/MEDQ  D:  05/12/2009  T:  05/12/2009  Job:  [id number]

## 2011-01-30 NOTE — Group Therapy Note (Signed)
Alex Waters, Alex Waters                ACCOUNT NO.:  [id number]   MEDICAL RECORD NO.:  [id number]          PATIENT TYPE:  INP   LOCATION:  1401                         FACILITY:  [facility]   PHYSICIAN:  Marcellus Scott, MD     DATE OF BIRTH:  1951/03/05                                 PROGRESS NOTE   DATE OF DISCHARGE:  To be determined.   PRIMARY MEDICAL DOCTOR:  Unassigned.   DISCHARGE DIAGNOSIS:  1. Seizure disorder.  2. Alcohol abuse.  3. Encephalopathy or delirium, improving.  4. Anemia.  5. Thrombocytopenia.  6. Hypertension.  7. Medication noncompliance.  8. Dementia.  9. Homelessness.  10.Tobacco abuse.   DISCHARGE MEDICATIONS:  1. Protonix 40 mg p.o. daily.  2. Folic acid 1 mg p.o. daily.  3. Thiamine 100 mg p.o. daily, indefinitely.  4. Nicotine 21 mg per 24-hour patch, 1 daily for 5 weeks then taper.  5. Dilantin 200 mg p.o. nightly.  Recommend repeating Dilantin levels      and albumin in 5 days and adjust dosage appropriately.  6. Albuterol 2.5 mg nebulizations inhaled q.6 hours p.r.n.  7. Tylenol 650 mg p.o. q.4 hourly p.r.n.  8. Ativan 0.5 to 1 mg p.o. q.6 hourly p.r.n. for anxiety or agitation.   PROCEDURES:  CT of the head without contrast.  Impression:  a.  Recently sustained right orbital fractures - stable.  b.  Left frontal scalp hematoma.  c.  No acute or focal intracranial abnormality.   PERTINENT LABS:  TSH 1.067, serum Dilantin 24.4.  CBCs hemoglobin 11.7, hematocrit 35.6,  white blood cell 6.7, platelets 127,000.  Basic metabolic panel within  normal limits with BUN 7, creatinine 1.07.  Urine drug screen negative.  Blood alcohol level less than 5.  Hepatic panel on August 23  unremarkable except for AST of 38.   CONSULTATIONS:  Psychiatry, Dr. Jeanie Sewer.   HOSPITAL COURSE AND PATIENT DISPOSITION:  Alex Waters is a 60 year old African American homeless male patient with  history of seizure disorder who is noncompliant to his medications.  [employer] Social [employer] has guardianship.  He apparently left AMA  after a recent hospitalization prior to transfer to a secure assisted  living facility in [facility].  He was now found in a car  apparently in a postictal state.  He was brought to the emergency room  for further evaluation and management.  In the emergency room he was  noted to have generalized tonic-clonic seizures.  He was loaded with 1  gram of Dilantin and 500 mg of Keppra and was admitted for further  evaluation and management.   PROBLEM LIST:  1. Seizure disorder.  The patient has had recurrent seizures secondary      to noncompliance with his medications.  His Dilantin level is supra-      therapeutic but this is after a load.  Pharmacy recommended      reducing his dose to 200 nightly and repeating his Dilantin level      in 5-7 days and adjusted appropriately.  The same is being done.  Will discontinue Keppra.  As long as he is compliant to Dilantin,      seizures should be controlled.  2. Alcohol abuse.  The patient has not demonstrated any features of      alcohol withdrawal.  He was placed on thiamine, multivitamins and      Ativan p.r.n.  3. Encephalopathy or delirium.  This is probably secondary to his      postictal state as well as history of dementia and alcohol abuse      and thiamine deficiency.  Dr. Jeanie Sewer first saw him on August 24      and indicated the patient lacks capacity to leave against medical      advice and recommended continuing thiamine indefinitely.  Yesterday      he was awake, alert and much more cooperative with his care.  Even      this morning when Dr. Jeanie Sewer saw him he was probably more      cooperative.  Dr. Jeanie Sewer indicated that he had delirium      superimposed on dementia which appears to be multifactorial,      possibly involving thymine deficiency component as well as      postictal component.  He indicated that Alex Waters decapacitating       diagnosis was his dementia.  Arrangements were made for the patient      to go to [facility]      tomorrow, May 13, 2009.  His guardian, Mr. Sunnie Nielsen, phone      number [phone], was contacted and he plans to take the patient to      that facility.  However, in the interim the patient became agitated      and indicated that he wanted to leave the hospital.  The nursing      staff and myself spoke with the patient.  The patient is awake,      alert, oriented only to self and place, not oriented to time and      indicates this is 2001 at one time and 2002 at other.  He indicates      he has no place to go and no source for food or medications.  When      asked why he wants to leave he indicates he just wants to leave.      He was made aware about the risks of leaving against medical advise      including and not limited to seizures and even death as a      consequence of same.  We do not believe the patient has the      capacity to understand this information and make a proper judgment.      However, on subsequently talking to him, the patient seems to have      calmed down and agreeable to stay overnight.  I have paged to      discuss his case further with Dr. Jeanie Sewer.  Will monitor him      overnight and consider discharging him to the assisted living      facility in      the morning.  Recommend repeating his Dilantin level and 5-7 days.      Obviously the patient should not drive until cleared by an M.D.      Marcellus Scott, MD  Electronically Signed     AH/MEDQ  D:  05/12/2009  T:  05/12/2009  Job:  [id number]  cc:   Antonietta Breach, M.D.   [facility]

## 2011-01-30 NOTE — Consult Note (Signed)
Alex Waters, Alex Waters NO.:  [id number]   MEDICAL RECORD NO.:  [id number]          PATIENT TYPE:  INP   LOCATION:  3027                         FACILITY:  [facility]   PHYSICIAN:  Antonietta Breach, M.D.  DATE OF BIRTH:  07-Jan-1951   DATE OF CONSULTATION:  07/09/2007  DATE OF DISCHARGE:                                 CONSULTATION   This one is marked priority because of the need to utilize the dictation  in facilitating discharge to a placement facility.   REQUESTING PHYSICIAN:  [facility].   REASON FOR CONSULTATION:  Mental status changes, assess capacity.   HISTORY OF PRESENT ILLNESS:  [name] is a 60 year old male  admitted to the [facility] on October 15 after a seizure and a  concussion as well as an intracerebral hemorrhage.   The patient also suffered rhabdomyolysis. Alex Waters continues with  impaired judgment, impaired reasoning and difficulty with memory. He is  not expressing any depressed mood, he has no thoughts of harming himself  or others. He does make illogical statements of confabulation. He shows  no hallucinations or delusions.   MENTAL STATUS EXAM:  Alex Waters is alert. He is oriented to self. He is  lying in a supine position in his hospital bed. His memory is impaired.  His speech shows slightly flat prosody. There is no dysarthria, affect  is flat, mood is within normal limits. Thought process involves some  illogia and confabulation. Thought content, no thoughts of harming  himself or others, no hallucinations or delusions. Judgment is impaired,  insight is poor.   ASSESSMENT:  294.9, unspecified persistent mental disorder not otherwise  specified. The patient appears to have a multifactorial dementia.   The patient demonstrates critical impairments in judgment, reasoning and  appreciation of his general medical and physical disabilities as well as  appreciation of the environmental and medical support that are  required  to sustain him.   Alex Waters does not the capacity for informed consent.      Antonietta Breach, M.D.  Electronically Signed     JW/MEDQ  D:  07/11/2007  T:  07/11/2007  Job:  [id number]

## 2011-01-30 NOTE — Consult Note (Signed)
NAMEROBERTSON, Alex Waters NO.:  [id number]   MEDICAL RECORD NO.:  [id number]          PATIENT TYPE:  INP   LOCATION:  3113                         FACILITY:  [facility]   PHYSICIAN:  Donalee Citrin, M.D.        DATE OF BIRTH:  January 06, 1951   DATE OF CONSULTATION:  07/02/2007  DATE OF DISCHARGE:                                 CONSULTATION   REASON FOR CONSULTATION:  Closed head injury, skull fracture, small  subdural hematoma and right occipital hemorrhagic contusion.   HISTORY OF PRESENT ILLNESS:  The patient is a 60 year old gentleman with  a known history of multiple medical problems to include a seizure  disorder, who lives homeless and also has a history of alcoholism and  was found under a bridge with altered mental status.  The patient  apparently had improved and was able to be conversant with EMS, however,  on arrival to the ED underwent another seizure and the patient has been  not responsive to commands while in the emergency department.   The patient's past medical history is otherwise none.  Surgical history  is also not available at this time, as well as medication history.   On exam, the patient is lethargic, does arouse to deep noxious  stimulation and withdraws symmetrically with 5/5 strength in all four  extremities; however, he will not follow commands.  Pupils are equal,  round, reactive to light.  Extraocular movements intact by roving eye  movements.  Does groan and vocalize to noxious stimuli.  TMs show with  the left TM not to show no sign of hemotympanum, and the right TM was  unable to be visualized.  The remainder of his cranial nerve exam  appears to be intact.  Strength appears to be 5/5 on withdrawal exam.  Reflexes appear to be brisk but symmetric.  Toes appear to be downgoing.  He does have rectal tone.   His laboratory on admission was remarkable for a CK of 587.  Sodium of  134, potassium 4.8, chloride 100, carbon dioxide 24, glucose 109,  BUN  and creatinine 10 and 0.95, respectively.  CBC has a white count of  13.9, hematocrit of 42.3, platelet count of 230.  A CT scan of his head  on admission shows a small right parietal skull fracture, also appears  to extend maybe even to the occipital suture.  There does appear to be a  small underlying skin subdural hematoma.  There is a small occipital 1-  cm hemorrhagic contusion with no mass effect.  He does have a fair  amount of cortical atrophy and sulci and gyri are widely patent and open  with no midline shift or no mass effect.   ASSESSMENT AND PLAN:  This is a 60 year old man with known seizure  disorder, homeless, with unknown other significant past history, who is  planning on being admitted to the hospital to the internal medicine  service for observation.  I have recommend serial neuro checks overnight  in the ICU or step-down unit with a repeat CT scan in about 12 hours.  With his seizure disorder, recommend treating him prophylactically with  antiepileptics, Dilantin or the like.  Consider consulting neurology for  seizure management.  Would also recommend putting him on the alcoholic  withdrawal cocktails.           ______________________________  Donalee Citrin, M.D.    GC/MEDQ  D:  07/02/2007  T:  07/03/2007  Job:  [id number]

## 2011-01-30 NOTE — H&P (Signed)
Alex Waters, Alex Waters NO.:  [id number]   MEDICAL RECORD NO.:  [id number]          PATIENT TYPE:  INP   LOCATION:  0102                         FACILITY:  [facility]   PHYSICIAN:  Theodosia Paling, MD    DATE OF BIRTH:  [birth year]   DATE OF ADMISSION:  05/09/2009  DATE OF DISCHARGE:                              HISTORY & PHYSICAL   PRIMARY CARE PHYSICIAN:  None.   CHIEF COMPLAINT:  Seizure.   HISTORY OF PRESENT ILLNESS:  Alex Waters is a 60 year old homeless  patient with a history of seizure disorder with a history of established  noncompliance who was found in a car apparently in a postictal state.  The patient was subsequently brought to the [facility]  emergency room  for further evaluation and management.  In the ER he was oriented x3.  However, after some time he started to have a generalized tonic-clonic  seizure.  He got loaded with Dilantin and [facility] hospitalist service was  contacted for further evaluation and management.   REVIEW OF SYSTEMS:  As per HPI, otherwise negative.   PAST MEDICAL HISTORY:  1. History of seizure disorder with noncompliance.  2. History of alcohol abuse.  3. History of tobacco abuse.   HOME MEDICATION:  The patient is supposed to be on Dilantin but he has  not taken Dilantin for a while according to him.   ALLERGIES:  HE IS DOCUMENTED AS HAVING ALLERGY TO PENICILLIN.  He denies  any allergies himself.   SOCIAL HISTORY:  The patient is homeless.  He smokes one pack in a week.  He has a history of alcohol abuse.  Denies any active intake at this  time.  Denies IV drugs, cocaine or marijuana.   FAMILY HISTORY:  Noncontributory.   EXAMINATION:  VITAL SIGNS:  Blood pressure 125/82, temperature 98.2,  afebrile.  Heart rate 76, respiratory rate 18, oxygen SATs 95% at 2  liters nasal cannula oxygen.  GENERAL EXAMINATION:  No acute cardiorespiratory distress.  HEENT:  EOM intact.  No ear, nose discharge.  LUNGS:  Normal breath  sounds.  No rhonchi or crepitations.  CARDIOVASCULAR:  S1, S2 normal.  No murmur or gallop heard.  GI:  Soft, nontender.  EXTREMITIES:  No pedal edema.  PSYCH:  Oriented x2.  CNS:  Speech intact and follows commands.   IMAGING:  CT scan of the head showing recent sustained right orbital  fracture which is stable, left frontal scalp hematoma.  No acute or  focal intracranial abnormality.   LABORATORY DATA:  WBC 10.9, hemoglobin 11.4, hematocrit 35.0, platelet  count 154, sodium 143, potassium 3.2, chloride 110, bicarbonate 17,  glucose 154, BUN 14, creatinine 1.19.  Alkaline phosphatase 90, AST 38,  ALT 17, total protein 6.9, albumin 2.7, calcium 9.4.  Urine tox screen  negative, alcohol less than 5.   ASSESSMENT AND PLAN:  1. Seizure disorder.  The patient is already loaded with IV Dilantin.      We will start him on maintenance dose of Dilantin.  I have also      started him on  IV Keppra.  He can be discharged on oral Dilantin      once his Dilantin level is therapeutic and he is seizure free for      next 1-2 days.  Initially he will receive it daily for 24 hours.      Neurologic checks will be performed.  Physical therapy,      occupational therapy consults are requested.  Social work consult      for placement given he is homeless.  He will be needing drug      assistance as well.  2. Tobacco abuse.  We will place him on nicotine patch.  3. Alcohol abuse.  The patient's blood alcohol level is normal.  Still      I put him on IV as needed Ativan.  4. Prophylaxis.  Deep vein thrombosis, gastrointestinal on board.  5. Code status.  The patient is full code.  Total time spent in admission of this patient around 45 minutes.  The patient does not have a primary care Leida L[facility]on to which this  admission note can be forwarded.      Theodosia Paling, MD  Electronically Signed     NP/MEDQ  D:  05/09/2009  T:  05/09/2009  Job:  [id number]
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
## 2011-01-30 NOTE — Consult Note (Signed)
NAME:  Alex Waters, Alex Waters NO.:   MEDICAL RECORD NO.:  [id number]           PATIENT TYPE:   LOCATION:                                 FACILITY:   PHYSICIAN:  Antonietta Breach, M.D.       DATE OF BIRTH:   DATE OF CONSULTATION:  05/10/2009  DATE OF DISCHARGE:                                 CONSULTATION   ADDENDUM:   ALLERGIES:  1. NONSTEROIDAL ANTI-INFLAMMATORY DRUGS.  2. COX-2 INHIBITORS.  3. PENICILLIN.   MEDICATIONS:  His MAR is reviewed.  Alex Waters is back on his Dilantin.  He is receiving thiamine 100 mg daily.   Urine drug screen, negative.  Alcohol, negative.  Sodium 143, BUN 14,  creatinine 1.19, SGOT 38, SGPT 17.  WBC 10.9, hemoglobin 11.4, and  platelet count 154.  Head CT without contrast, recently sustained right  orbital fracture, stable left frontal scalp hematoma, no acute or focal  intracranial abnormality.   REVIEW OF SYSTEMS:  Alex Waters cannot provide, this is gleaned from the  medical record and staff.   CONSTITUTIONAL:  Head, eyes, ears, nose, throat, mouth, neurologic,  psychiatric, cardiovascular, respiratory, gastrointestinal,  genitourinary, skin, musculoskeletal, hematologic, lymphatic, endocrine,  and metabolic, all unremarkable.   PHYSICAL EXAMINATION:  VITAL SIGNS:  Temperature 97.6, pulse 62,  respiratory rate 16, blood pressure 105/66, and O2 saturation room air  100%.  GENERAL APPEARANCE:  Alex Waters is an elderly male, lying in a supine  position in his hospital bed with no abnormal involuntary movements.   MENTAL STATUS EXAM:  Alex Waters has clouding of consciousness.  His  concentration is decreased.  His attention span is decreased.  Affect is  mildly anxious. Mood is mildly anxious.  On orientation testing, he  states that the year is 64, he does not know the day or the month.  On  memory testing, he names 3/3 objects immediately and 0/3 on recall.  His  fund of knowledge and intelligence are below that of his  estimated  premorbid baseline.  His speech is soft.  Thought process is coherent at  the time of the  examination.  Thought content, no thoughts of harming  himself or others.  No delusions or hallucinations.  His insight is  poor.  His ability to appreciate his risk of mortality and morbidity is  poor.  His judgment is impaired.   ASSESSMENT:  Axis I:  1. Delirium, 293.00, not otherwise specified.  2. Rule out underlying dementia, not otherwise specified.  3. History of alcohol dependence.  Axis II:  Deferred.  Axis III:  See past medical history.  Axis IV:  General medical.  Axis V:  30.   Alex Waters has critical deficits in memory function.  Judgment and the  ability to appreciate his risk of morbidity and mortality.  He does not  have the capacity to leave the hospital against medical advice.   He does meet the criteria for being held under the implied consent rule.   Given his current mental deficits, he requires 24-hour per day  supervision to avoid lethal, self-neglect.   Regarding treatment, it is difficult to determine whether he has a  chance of responding to thiamine given the limited history data.   Some cases have shown memory response after a number of weeks.   If he does continue with memory dysfunction, would ensure that a  reversible memory dysfunction etiology workup has been completed.   If the workup is negative, would consider starting Aricept 5 mg nightly  with caution regarding gastrointestinal side effects such as loose  stools.   If his cognitive and memory function does return, he will be a candidate  for the 12-step method.      Antonietta Breach, M.D.     JW/MEDQ  D:  03/21/2010  T:  03/22/2010  Job:  [id number]

## 2011-01-30 NOTE — Consult Note (Signed)
NAME:  Alex Waters, Alex Waters                     ACCOUNT NO.:   MEDICAL RECORD NO.:  [id number]           PATIENT TYPE:   LOCATION:                                 FACILITY:   PHYSICIAN:  Antonietta Breach, M.D.       DATE OF BIRTH:   DATE OF CONSULTATION:  05/10/2009  DATE OF DISCHARGE:                                 CONSULTATION   REQUESTING PHYSICIAN:  [facility].   REASON FOR CONSULTATION:  Acute mental status changes, trying to leave  the hospital against medical advice.   HISTORY OF PRESENT ILLNESS:  [name] is a 60 year old male  admitted to the [facility] on May 09, 2009, due to a  seizure disorder.  He does have a long-term history of a seizure  disorder with a pattern of noncompliance with his medication.  He has  developed approximately 3 days of progressive confusion, disorientation.  Thought process incoherent and thought process disorganization.  He also  has been displaying acute worsening of memory, poor judgment, and poor  appreciation of his risk of mortality and morbidity.  He has been  requesting to leave the hospital against medical advice.   He does have a history of alcohol abuse; however, it is unknown what his  alcohol pattern was just prior to admission and his seizure.   His mental status changes have not improved with his general medical  care for psychosocial efforts.   PAST PSYCHIATRIC HISTORY:  In review of the past medical record, Mr.  Alex Waters did have a period in the fall of 2008 where he was demonstrating  alogia and confabulation along with impaired judgment and poor insight.  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   PAST MEDICAL HISTORY:  History of seizure disorder with noncompliance.  He has not been taking his Dilantin.   ALLERGIES:  PENICILLIN.   FAMILY PSYCHIATRIC HISTORY:  None known.   SOCIAL HISTORY:  [name] is medically disabled and retired.  He is  homeless.  He denied  illegal drugs when first interviewed upon  admission.  His acute history of alcohol is unknown.   DICTATION ENDED AT THIS POINT.      Antonietta Breach, M.D.     JW/MEDQ  D:  03/21/2010  T:  03/22/2010  Job:  [id number]

## 2011-01-30 NOTE — Discharge Summary (Signed)
Alex Waters, Alex NO.:  Waters   MEDICAL RECORD NO.:  [id number]          PATIENT TYPE:  INP   LOCATION:  1508                         FACILITY:  [facility]   PHYSICIAN:  Marcellus Scott, MD     DATE OF BIRTH:  [birth year]   DATE OF ADMISSION:  05/09/2009  DATE OF DISCHARGE:  05/13/2009                               DISCHARGE SUMMARY   ADDENDUM:  This is an addendum to the discharge summary that was  dictated by Dr. Waymon Amato on May 06, 2009.   PRIMARY MEDICAL DOCTOR:  Unassigned.   Please refer to the discharge summary dictated by this dictator  yesterday for discharge diagnoses and discharge medications.   The only change in the discharge medications is :Ativan is changed to 1-  3 mg p.o. q. 3 h p.r.n. for agitation.  Hold for sedation.   DISCUSSION:  On May 12, 2009, the patient was transferred to the med  psych floor.  I discussed with Dr. Jeanie Sewer, who indicated that the  patient could be held in the hospital against his wishes, based on  implied consent for following two reasons.   1. The patient lacked capacity to understand the detrimental      consequences of his refusal to care, as well as leave against      medical advise.  He hence could not leave against medical advice.  2. The patient was in danger for lethal self-neglect.   Dr. Jeanie Sewer recommended Ativan 1-3 mg p.o. or IM q. 3 h p.r.n. and if  Ativan was ineffective, then he recommended Zyprexa 5 mg IM b.i.d.  p.r.n.  The patient, at this time, indicates he is all right and  denies any complaints.  He has not had any further seizures.  He has not  required any of the Ativan or Zyprexa for agitation.  At this time, his  physical examination is stable and the patient is stable to discharge to  the assisted living facility with his guardian, who is expected to pick  him up shortly.  I did discuss discharge plans with Dr. Jeanie Sewer and he  indicated the patient could be discharged to the  assisted living  facility.      Marcellus Scott, MD  Electronically Signed     AH/MEDQ  D:  05/13/2009  T:  05/13/2009  Job:  [id number]   cc:   Assisted Living Facility
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
## 2011-01-30 NOTE — Discharge Summary (Signed)
NAMESAWYER, MENTZER NO.:  [id number]   MEDICAL RECORD NO.:  [id number]          PATIENT TYPE:  INP   LOCATION:  3027                         FACILITY:  [facility]   PHYSICIAN:  Hettie Holstein, D.O.    DATE OF BIRTH:  May 31, 1951   DATE OF ADMISSION:  07/02/2007  DATE OF DISCHARGE:                               DISCHARGE SUMMARY   ADDENDUM:  Addendum to discharge summary as dictated by Dr. Isidor Holts on July 06, 2007, job number [id number].   For details of Mr. [facility] hospital course prior to July 11, 2007,  please refer to the summary of Dr. Brien Few summarizing those events.  In  addendum, since that time, Mr. [facility] hospital course has been that  essentially of medical stability.  He underwent psychiatric consultation  and was determined to lack the ability to understand or make decisions  about his medical care or to provide informed consent.  Due to no  available decision makers for Mr. Lipson after best efforts,  guardianship is being pursued.  Case workers are currently pursuing a 24-  hour supervised situation for Mr. Stilley who was formerly homeless.   ADDITIONAL DIAGNOSES:  1. 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  His urine osmolarity was 695, and he has been      placed on free water restriction at 900 mL per day in addition to      demeclocycline at three times a day dosing, 300 mg three times a      day.  His sodium has begun to come up a bit.  It is now 120.      Because his low sodium and upward trending potassium, we were      checking some cortisol levels.  2. Supratherapeutic Dilantin level.  This was on a dose of 300 mg by      mouth every evening.  Yesterday evenings dose  was held, and a      pharmacy is assisting in the final dose upon his transfer, though      it is anticipated that this will be at 200 mg by mouth every      evening once his levels fall into the normal therapeutic  range.   A final dictation addressing his medications on discharge will be  provided once his Dilantin levels fall into the normal therapeutic  ranges and he is found a suitable place for discharge.      Hettie Holstein, D.O.  Electronically Signed     ESS/MEDQ  D:  07/15/2007  T:  07/15/2007  Job:  [id number]

## 2011-02-02 NOTE — H&P (Signed)
Alex Waters, Alex Waters                ACCOUNT NO.:  [id number]   MEDICAL RECORD NO.:  [id number]          PATIENT TYPE:  INP   LOCATION:  3025                         FACILITY:  [facility]   PHYSICIAN:  Hind I Elsaid, MD      DATE OF BIRTH:  12/15/1950   DATE OF ADMISSION:  11/08/2006  DATE OF DISCHARGE:                              HISTORY & PHYSICAL   PRIMARY CARE PHYSICIAN:  Unassigned.   PRESENT COMPLAINT:  Altered mental status and seizure.   HISTORY OF PRESENT ILLNESS:  A 60 year old male with a known history of  alcoholism and seizure disorder on Dilantin, no complaint with the  Dilantin.  According to him last Dilantin has been taken one month ago.  Patient was homeless, found on the street status post fall by the  police, brought to the emergency room.  Patient developed seizure during  evaluation in the emergency room.  Patient seemed confused, disoriented  and unable to give a good history.  Patient at this time is currently  awake and disoriented but he knows which hospital he is in, but he  cannot answer questions appropriately.   PAST MEDICAL HISTORY:  1. According to the previous records is significant for alcohol abuse.  2. Tobacco abuse.  3. History of previous altered mental status stage.  4. History of seizure in the setting of alcohol withdrawal.  5. Medication noncompliance.   MEDICATIONS:  Patient on Dilantin 100 mg p.o. t.i.d.   SOCIAL HISTORY:  Seems to be patient is homeless, lives on the streets,  drinks alcohol and smokes a lot of cigarettes as he is asking for a  cigarette at this time.   ALLERGIES:  SEEM TO BE PENICILLIN.   FAMILY HISTORY:  Unobtainable.   REVIEW OF SYSTEMS:  Unreliable as the patient is not completely  oriented.   PHYSICAL EXAMINATION:  VITAL SIGNS:  Today his temperature is 98.7,  blood pressure one reading of 199/126 in the ER, 139/126, pulse rate of  103, respiratory rate of 20, saturating 97% on room air.  GENERAL:  The  patient is confused, awake, responding to questions but  inappropriately.  Not well groomed.  HEENT:  There is swelling in the left eyebrow.  There is some scar  evidence of a fall.  There is swelling of the left eyeball but there is  no hemorrhage on the eye itself.  When questioned him, he admitted he  had fallen on that side.  Also the patient has some erythema and  laceration on the frontal area, but there is no active bleeding at this  time.  Dry mucous membranes, poor dentition and hygiene.  Patient is  smelling really bad.  RESPIRATORY:  Good air entry bilaterally.  No wheezing or rales.  CARDIOVASCULAR:  Heart regular rate and rhythm.  ABDOMEN:  Soft, nontender with positive bowel sounds.  No edema,  cyanosis or clubbing.   LABORATORY DATA:  His blood workup shows bicarb 21.3, hemoglobin 16.7,  hematocrit 49, sodium 135, potassium 3.9, chloride 107, glucose 96, BUN  11.  Alcohol level was less than 0.5.  Dilantin level was less than 2.5,  CBC is 0.4, hemoglobin 14.4, hematocrit 44, platelets 182, sodium 135,  potassium 3.8, chloride 99, CO2 21, glucose 92, BUN 11, calcium 0.93.  CT head was done which was negative for bleed or other acute  intracranial bleeding, nonspecific white matter change.  Left frontal  scalp hematoma.  C-spine negative for fracture or other acute bone  abnormality.  Multilevel degenerative change as above.  Chest x-ray:  No  active lung disease.   ASSESSMENT AND PLAN:  Patient is a 60 year old male known to [place] with a  history of seizure disorder, alcoholic, homeless who presents at this  time with altered mental status with two seizures.  1. Seizure disorder.  Admit the patient to the neurology floor.  2. Altered mental status both related to alcohol withdrawal and also      possibly due to the seizures.  We will start the patient on IV      fluid hydration.  We will start him on his Dilantin as his Dilantin      level was subtherapeutic with folate  might be cause of this altered      mental status.   PROBLEM LIST:  1. Altered mental status.  2. Seizure disorder.  3. History of alcohol abuse with the possibility of alcohol      withdrawal.  4. Hypertension.   PLAN:  Admit to neurology.  Seizure precautions.  Start the patient on  Dilantin 100 mg p.o. t.i.d., Ativan 1 mg IV q.8 h. P.r.n. and folic acid  and saline.  Patient needs Artist for evaluation.  For high blood pressure we will start the patient on  hydrochlorothiazide, a small dose, with Norvasc 5 mg p.o. b.i.d.  Upon  reviewing the previous history, social worker being evaluated on the  care, patient refused any further placement in a shelter.  He apparently  also has refused application for Medicaid and disability at this time.  DVT and GI prophylaxis.      Hind Bosie Helper, MD     HIE/MEDQ  D:  11/09/2006  T:  11/10/2006  Job:  [id number]

## 2011-02-02 NOTE — H&P (Signed)
Alex Waters, Alex Waters                ACCOUNT NO.:  [id number]   MEDICAL RECORD NO.:  [id number]          PATIENT TYPE:  EMS   LOCATION:  ED                            FACILITY:  [facility]   PHYSICIAN:  Lonia Blood, M.D.      DATE OF BIRTH:  14-Aug-1951   DATE OF ADMISSION:  03/12/2005  DATE OF DISCHARGE:  LH                                HISTORY & PHYSICAL   PRIMARY CARE PHYSICIAN:  Unassigned.   PRESENTING COMPLAINT:  Altered mental status change and confusion.   HISTORY OF PRESENT ILLNESS:  The patient is a 60 year old with known history  of alcoholism, who was last admitted in  April 2006 in [place] with seizure disorder.  The patient left AMA on  November 02, 2004. He was found today by the [employer] wandering around  here in [place].  He seemed to be confused, disoriented, and unable to  give adequate history.  Hence, the sheriff deputy decided to bring him into  the hospital.  The patient is currently awake but disoriented.  He does not  know which hospital he is in.  Does not know the date or the time.  The  patient is asking questions such as why am I here?   PAST MEDICAL HISTORY:  1.  Alcohol abuse.  2.  Tobacco abuse.  3.  History of previous altered mental status change.  4.  History of seizure in the setting of alcohol withdrawal; thereby      possibly alcohol withdrawal seizure and not necessarily true seizure      disorder.  5  Medication noncompliance.   MEDICATIONS:  The patient is supposed to be on Dilantin 100 mg p.o. t.i.d.   SOCIAL HISTORY:  The patient is unable to give adequate history, but from  previous records, the patient apparently lives out in the street, drinks  alcohol, and smokes a lot of cigarettes as he is asking for a cigarette at  this time.   ALLERGIES:  Questionable allergy to PENICILLIN from a previous record.   FAMILY HISTORY:  Unobtainable at this point.   REVIEW OF SYSTEMS:  Also unreliable at this point as patient is not able  to  give adequate history.   PHYSICAL EXAMINATION:  VITAL SIGNS:  On his exam today, his temperature is  99.8, blood pressure 126/100, with a pulse of 90.  Respiratory rate is 20.  Saturation is 97% on room air.  GENERAL:  The patient is confused but awake, responding to questions but  inappropriately.  He seems to be generally unkempt.  HEENT:  PERRLA.  EOMI.  The patient has bloodshot eyes, dry mucous  membranes, poor dentition, and poor hygiene.  NECK:  Supple.  No JVD, no lymphadenopathy.  RESPIRATORY:  Good air entry bilaterally.  No wheezes or rales.  CARDIOVASCULAR:  The patient has regular rate and rhythm.  ABDOMEN: Soft, nontender, with positive bowel sounds.  EXTREMITIES:  No edema, cyanosis, or clubbing.   LABORATORY DATA AND OTHER STUDIES:  His labs showed a white count of 6.5,  hemoglobin 11.2, with an MCV  of 90.4.  Platelet count 161.  Sodium 139,  potassium 3.7, chloride 107, CO2 24, glucose 98, BUN 22, creatinine 1.4,  total bilirubin 0.8, AST 131, ALT 31, total protein 7.2, albumin 3.9,  calcium 9.1.  Alcohol level is less than 5.  PT 15, INR 1.2.  Dilantin level  is 6.1 which is low.  His ammonia level is 18.  Initial cardiac markers:  Myoglobin more than 500, CK-MB 37.2.  UA shows urine with specific gravity  1.030, small bilirubin, small ketones, moderate blood, protein 30,  urobilinogen 1, negative for leukocyte esterase.  Urine drug screen positive  mainly for benzodiazepines.  Urine microscopy showed white count of 0 to 2,  few squamous cells and casts.  No red blood cells reported   EKG showed normal sinus rhythm with a rate of 74, evidence of LVH by voltage  criteria, possible left atrial enlargement.   ASSESSMENT:  This is a 60 year old known alcoholic, brought in secondary to  disorientation and found wandering around by the sheriff's deputy.  Based on  patient's history and the fact that his alcohol level is less than 5, the  number 1 possibility is  alcohol withdrawal.  The patient also has  benzodiazepines in his urine, although there is no history of him being on  any benzodiazepines.  Chances are, therefore, that patient may have also had  benzodiazepines and could be intoxicated.  However, his presentation is not  consistent.  There are no respiratory problems and no other findings to  suggest intoxication with benzodiazepines.  Chances are more of withdrawal  from both.  He may also be withdrawing from tobacco as he is consistently  asking for tobacco to smoke.  It is not clear whether patient lives alone or  with someone else, but he is certainly a danger to himself if he lives  alone.   PLAN:  1.  Altered mental status change:  Will admit him at least for 23-hour      observation.  Will monitor him closely for possible seizure which may      have been the case. If patient is postictal, especially with his low      Dilantin level, he could also have disorientation.  Will also observe      him for possible delirium tremens (DT)  and put him on a protocol for DT      once symptoms show.  In the meantime, however, I will give p.r.n. Ativan      for any agitation. Due to his history of alcoholism, I will also put him      on thiamine folate and hydrate him with D5 normal saline.  I will also      put him on some seizure precautions as well.  2.  Seizure disorder:  The patient's Dilantin level is subtherapeutic.  I      will give him a single dose of 300 mg now, which is more of a loading      dose, and then continue with 100 mg t.i.d.  The patient seems to very      noncompliant as an outpatient.  His previous admission also showed very      low Dilantin level, but there is no evidence of Dilantin intoxication      which may have also caused his problem.  3.  History of tobacco abuse.  Will counsel the patient, and I will put him      on some nicotine patch  while in the hospital. 4.  Increased cardiac enzymes.  The patient is at  risk for getting some      rhabdomyolysis; however, his renal function looks good.  I will,      however, check CK and CK-MB and list a couple more times to make sure      there is no trend of increasing CK.  With hydration, hopefully that will      help.       LG/MEDQ  D:  03/12/2005  T:  03/12/2005  Job:  [id number]

## 2011-02-02 NOTE — Discharge Summary (Signed)
NAMERANNIE, CRANEY NO.:  [id number]   MEDICAL RECORD NO.:  [id number]          PATIENT TYPE:  INP   LOCATION:  3025                         FACILITY:  [facility]   PHYSICIAN:  Lonia Blood, M.D.       DATE OF BIRTH:  [birth year]   DATE OF ADMISSION:  11/09/2006  DATE OF DISCHARGE:  11/11/2006                               DISCHARGE SUMMARY   PRIMARY CARE PHYSICIAN:  The patient's primary care physician is  unassigned.   DISCHARGE DIAGNOSES:  1. Alcoholism.  2. Seizure most likely related to alcohol withdrawal.  3. Facial trauma secondary to the seizure with large left frontal      scalp hematoma.   DISCHARGE MEDICATION:  Dilantin 100 mg by mouth three times a day.   CONDITION ON DISCHARGE:  Alex Waters left against medical advice.   PROCEDURES DURING THIS ADMISSION:  On February23,2008, the patient  underwent computed tomography scan of the head and cervical spine with  findings of negative for intracranial bleed, large left frontal scalp  hematoma and multilevel degenerative changes in the cervical spine  without any acute fracture or bony injury in the cervical spine.   CONSULTATION:  No consultations were obtained.   ADMISSION HISTORY AND PHYSICAL:  For admission history and physical  refer to dictated H&P done by Dr. Eda Paschal on November 08, 2006.   HOSPITAL COURSE:  Altered mental status.  Alex Waters was admitted from  the emergency room with altered mental status.  The etiology of the  patient's altered mental status was obviously secondary to postictal  state.  The patient cleared quickly and by hospital day #2 he requested  to leave the hospital.  I personally had a long discussion with the  patient and explained to him the reasons why I think he should stay,  including the fact that he needs to complete an Ativan taper for alcohol  withdrawal.  He is to be provided with options in terms of assisted  living versus skilled nursing facility.  The patient  though refused any  of these and left the hospital against medical advice.      Lonia Blood, M.D.  Electronically Signed     SL/MEDQ  D:  11/18/2006  T:  11/18/2006  Job:  [id number]

## 2011-02-02 NOTE — Discharge Summary (Signed)
[place]  Patient:    Alex Waters, Alex Waters                       MRN: [id number] Adm. Date:  [id number] Disc. Date: [id number] Attending:  Madaline Guthrie Dictator:   Andrey Spearman CC:         [facility]                           Discharge Summary  DISCHARGE MEDICATIONS: 1.  Dilantin 300 mg p.o. q.d.  DISCHARGE DIAGNOSES: 1.  Seizure disorder. 2.  Alcohol abuse. 3.  Medication noncompliance.  HISTORY OF PRESENT ILLNESS:  The patient is a 60 year old African-American male who was seen by bystanders to have a full body generalized seizure of unknown duration. When EMS arrived the patient was postictal and the patient had generalized tonoclonic seizure lasting 25-30 seconds in the ER, which the ER M.D. observed.  The patient was given 2 mg of Ativan and 1 gram of Dilantin load.  The patients  dilantin level on admission was found to be less than 2.5.  The patient did not  remember any events leading up to seizure but reported that he had not taken his medications that day.  He only said the reason he did not take the medications as because he did not have them with him at the time.  Other admission laboratories were noncontributory.  The patient was admitted for Dilantin load and observation for repeat seizures.  HOSPITAL COURSE: #1 - SEIZURE DISORDER:  The patient was loaded with gram of Dilantin and given mg of Ativan.  Status post these medications the patient did not have any other seizures.  The patient began to become more lucid and by the evening of his admission the patient was awake and alert and oriented.  The patient had no focal findings on his neurologic examination throughout his admission.  The patient reported that the reason he did not take his medications was because he did not  have them with him at the time.  The patient says he will take them now and we ave simplified his medications from 100 mg t.i.d. to 300 p.o.  q.h.s.  #2 - ALCOHOL ABUSE:  The patient reports that he drinks about a fifth of alcohol per week and he is interested in quitting at this time.  A social work consultation was obtained and the patient was given his rehabilitation options.  The patient is to call [facility] for assessment of his needs.  The patient will follow up with [facility].  The patient is to schedule this appointment when he goes to [facility] to get a refill of his Dilantin today so the patient can schedule an appointment which is most convenient for him. DD:  11/27/99 TD:  11/27/99 Job: [id number]

## 2011-02-02 NOTE — Discharge Summary (Signed)
NAMECRESCENCIO, JOZWIAK                ACCOUNT NO.:  [id number]   MEDICAL RECORD NO.:  [id number]          PATIENT TYPE:  INP   LOCATION:  A329                          FACILITY:  [facility]   PHYSICIAN:  Osvaldo Shipper, MD     DATE OF BIRTH:  11-10-1950   DATE OF ADMISSION:  03/12/2005  DATE OF DISCHARGE:  06/29/2006LH                                 DISCHARGE SUMMARY   DISCHARGE DIAGNOSES:  1.  Altered mental status likely secondary to postictal state.  2.  Mild rhabdomyolysis.  3.  Seizure disorder.  4.  Alcohol abuse.   Please see H&P dictated March 12, 2005 for details regarding the patients  presenting illness.   BRIEF HOSPITAL COURSE:  1.  The patient was admitted with altered mental status.  He is a 60-year-      old African-American male with a history of seizure disorder likely      secondary to alcohol withdrawal and also has a history of alcohol abuse.      On admission the patient was awake, however responding to questions      inappropriately.  During the course of his admission his mental status      has improved.  He is more awake and alert and he is more oriented at      this time.  The etiology for his altered mental status was likely      secondary to a postictal state.  His Dilantin level was subtherapeutic      at the time of admission.  2.  Mild rhabdomyolysis -- on admission the patients CK was noted to be      elevated.  It was about 3800 at the time of initial testing.  The CK's      were monitored closely and on the day of discharge was about 1000.  The      patient is passing an adequate amount of urine.  He does not have any      renal insufficiency at this time.  The patient has been asked to      continue p.o. hydration at discharge.  His potassium levels were also      within normal limits.  3.  Seizure disorder -- as mentioned above the patient was found to have a      low Dilantin level at the time of admission.  He was loaded orally and      his  subsequent levels were in the therapeutic range.  The patient was      asked to continue his baseline dosage of Dilantin.  4.  High blood pressure -- the patient at the time of this admission was      found to have elevated blood pressure.  We started the patient on a      calcium channel blocker, however it appears he may need one more agent.      I am going to prescribe him diltiazem as well as hydrochlorothiazide.  5.  Alcohol abuse -- at the time of admission the patients blood alcohol  level was not elevated.  The patient was not seen to go into DTs during      this admission.  He was started on thiamine and folate.  He was      counseled against use of the alcohol.  We also had the [facility] come and      evaluate him and they recommended outpatient followup for this patient.   DISCHARGE MEDICATIONS:  1.  Diltiazem 30 mg p.o. twice daily.  2.  Hydrochlorothiazide 25 mg p.o. once daily.  3.  Dilantin 300 mg p.o. q.h.s.  4.  Thiamine 100 mg p.o. daily.   FOLLOWUP:  An appointment will be made for this patient to go [facility] in one weeks time to follow-up on his blood  pressure.   DIET:  The patient needs to have a low salt diet.   ACTIVITY:  As tolerated.   SOCIAL SITUATION:  The patient is apparently homeless.  He lives with his  friends on and off.  The clinical social worker in the hospital has been  following with the patient and offering various options including placement  in a shelter.  However the patient has refused that.  He apparently also has  refused applications for Medicaid and Disability at this time.  I will have  a Child psychotherapist see the patient one more time prior to discharge, however he  is medically cleared to be discharged from this hospital.       GK/MEDQ  D:  03/15/2005  T:  03/15/2005  Job:  [id number]

## 2011-02-02 NOTE — Discharge Summary (Signed)
Alex Waters, Alex Waters NO.:  [id number]   MEDICAL RECORD NO.:  [id number]          PATIENT TYPE:  INP   LOCATION:  2018                         FACILITY:  [facility]   PHYSICIAN:  Caprice Beaver, MD       DATE OF BIRTH:  03-21-1951   DATE OF ADMISSION:  10/30/2004  DATE OF DISCHARGE:  11/02/2004                                 DISCHARGE SUMMARY   Of note, the patient left AMA and was not technically discharged from the  hospital.   DIAGNOSES AT TIME OF PATIENT LEAVING AMA:  1.  Seizure in the setting of history of seizure disorder.  2.  Altered mental status.  3.  Alcohol abuse.   MEDICATIONS:  Alex Waters was supposed to be taking Dilantin 300 mg daily.   ADMITTING HISTORY AND PHYSICAL:  Alex Waters is a 60 year old man with  history of seizures of uncertain etiology.  He does have a history of  alcohol abuse, but it is unclear whether his seizures are secondary to  withdrawal or primary seizure disorder.  He has been treated with consistent  dose of Dilantin for over 10 years, but takes medication erratically.  He  was brought to the EMS after being found on the ground having grand mal  seizures as witnessed by bystander. They observed three different episodes  of the seizure.  He was administered Ativan by EMS at which time the  seizures stopped.   PHYSICAL EXAM ON ADMISSION:  VITAL SIGNS:  Temperature 98.5, pulse 79, blood  pressure 163/115, respirations 16, O2 saturation 90% on room air.  GENERAL:  Lethargic and moves to tactile stimuli.  HEENT:  Pupils equal, round, reactive to light and accommodation.  [place].  Poor dentition.  NECK:  Supple.  No jugular venous distention.  LUNGS:  Bilateral rhonchi.  CARDIOVASCULAR:  Regular rate and rhythm.  No murmurs, rubs or gallops.  Soft, positive bowel sounds.  Nontender, nondistended.  EXTREMITIES:  He had a laceration above his left eyebrow.  Moved all  extremities spontaneously.   LABORATORY DATA:  Sodium 134,  potassium 4.6, chloride 106, BUN 14, glucose  84, pH 7.38.  Dilantin level less than 2.5.  Ethanol level less than 5.  PT,  INR 12.8 and 1.0.  CK 772, lactic acid 1.2.  Chest x-ray showed infiltrate  in right upper lobe.  CT of the head stable with no acute findings.  Creatinine is 0.9, bicarb 23.   HOSPITAL COURSE:  Alex Waters was admitted and started on Dilantin after  intravenous loading.  Dilantin levels were checked to ensure that he was  therapeutic.  He continued to have altered mental status for several days  during admission and eventually became more alert at which time he became  more belligerent and disagreeable with this therapy.  Because of history of  alcohol abuse and ETOH level of less than 5, he was started on Ativan  protocol as well.  He was to discharged to follow up with his physician at  outpatient clinic regarding this issue as well as other issues.  He also  was  being treated for presumed right upper lobe pneumonia with Augmentin.  He  left AMA and before followup could be scheduled.   LABS AT DISCHARGE:  Sodium 136, potassium 4.3, chloride 108, bicarb 22, BUN  8, creatinine 0.8, glucose 79, triglycerides 50, total cholesterol 180, HDL  87, LDL 83, Dilantin 45.4.  Urine drug screen negative.      BM/MEDQ  D:  12/29/2004  T:  12/29/2004  Job:  [id number]

## 2011-06-26 LAB — PHENYTOIN LEVEL, TOTAL: Phenytoin Lvl: 22.8 — ABNORMAL HIGH

## 2011-06-26 LAB — COMPREHENSIVE METABOLIC PANEL
BUN: 37 — ABNORMAL HIGH
CO2: 25
Chloride: 92 — ABNORMAL LOW
Creatinine, Ser: 1.68 — ABNORMAL HIGH
GFR calc non Af Amer: 42 — ABNORMAL LOW
Total Bilirubin: 0.4

## 2011-06-27 LAB — URINE CULTURE: Special Requests: NEGATIVE

## 2011-06-27 LAB — BASIC METABOLIC PANEL
BUN: 10
BUN: 11
BUN: 13
BUN: 13
BUN: 18
BUN: 8
BUN: 8
BUN: 9
BUN: 9
CO2: 26
CO2: 27
CO2: 27
CO2: 28
Calcium: 8.6
Calcium: 9.2
Calcium: 9.2
Calcium: 9.4
Calcium: 9.8
Chloride: 81 — ABNORMAL LOW
Chloride: 88 — ABNORMAL LOW
Chloride: 90 — ABNORMAL LOW
Chloride: 91 — ABNORMAL LOW
Creatinine, Ser: 0.86
Creatinine, Ser: 0.86
Creatinine, Ser: 0.93
Creatinine, Ser: 0.95
Creatinine, Ser: 1.05
Creatinine, Ser: 1.08
Creatinine, Ser: 1.09
GFR calc Af Amer: >60
GFR calc Af Amer: >60
GFR calc Af Amer: >60
GFR calc Af Amer: >60
GFR calc Af Amer: >60
GFR calc non Af Amer: >60
GFR calc non Af Amer: >60
GFR calc non Af Amer: >60
GFR calc non Af Amer: >60
GFR calc non Af Amer: >60
GFR calc non Af Amer: >60
Glucose, Bld: 100 — ABNORMAL HIGH
Glucose, Bld: 100 — ABNORMAL HIGH
Glucose, Bld: 108 — ABNORMAL HIGH
Glucose, Bld: 165 — ABNORMAL HIGH
Glucose, Bld: 90
Glucose, Bld: 95
Glucose, Bld: 96
Potassium: 3.3 — ABNORMAL LOW
Potassium: 3.6
Potassium: 4.5
Potassium: 4.5
Potassium: 4.7
Potassium: 4.8
Sodium: 120 — ABNORMAL LOW
Sodium: 122 — ABNORMAL LOW
Sodium: 132 — ABNORMAL LOW
Sodium: 134 — ABNORMAL LOW

## 2011-06-27 LAB — MAGNESIUM: Magnesium: 1.9

## 2011-06-27 LAB — COMPREHENSIVE METABOLIC PANEL
ALT: 11
ALT: 26
ALT: 30
AST: 43 — ABNORMAL HIGH
Albumin: 3.5
Albumin: 3.9
Alkaline Phosphatase: 136 — ABNORMAL HIGH
BUN: 14
BUN: 18
BUN: 19
BUN: 2 — ABNORMAL LOW
Calcium: 10
Calcium: 7.9 — ABNORMAL LOW
Calcium: 9.9
Chloride: 83 — ABNORMAL LOW
Creatinine, Ser: 0.77
Creatinine, Ser: 0.91
Creatinine, Ser: 1.1
GFR calc Af Amer: >60
GFR calc non Af Amer: >60
GFR calc non Af Amer: >60
Glucose, Bld: 100 — ABNORMAL HIGH
Glucose, Bld: 106 — ABNORMAL HIGH
Potassium: 5
Sodium: 118 — CL
Sodium: 120 — ABNORMAL LOW
Sodium: 126 — ABNORMAL LOW
Total Bilirubin: 0.4
Total Protein: 6
Total Protein: 8.2
Total Protein: 8.4 — ABNORMAL HIGH

## 2011-06-27 LAB — CBC
Hemoglobin: 14.3
MCHC: 33.7
Platelets: 183
RBC: 3.64 — ABNORMAL LOW
RBC: 4.57

## 2011-06-27 LAB — PHENYTOIN LEVEL, TOTAL
Phenytoin Lvl: 15.6
Phenytoin Lvl: 22.7 — ABNORMAL HIGH
Phenytoin Lvl: 23.1 — ABNORMAL HIGH
Phenytoin Lvl: <2.5 — ABNORMAL LOW

## 2011-06-27 LAB — OSMOLALITY: Osmolality: 267 — ABNORMAL LOW

## 2011-06-27 LAB — DIFFERENTIAL
Lymphocytes Relative: 5 — ABNORMAL LOW
Lymphs Abs: 0.7
Monocytes Absolute: 0.6
Monocytes Relative: 5
Neutro Abs: 12.5 — ABNORMAL HIGH

## 2011-06-27 LAB — I-STAT 8, (EC8 V) (CONVERTED LAB)
Acid-base deficit: 6 — ABNORMAL HIGH
BUN: 11
Chloride: 106
Glucose, Bld: 136 — ABNORMAL HIGH
Potassium: 6.6
pH, Ven: 7.355 — ABNORMAL HIGH

## 2011-06-27 LAB — CK TOTAL AND CKMB (NOT AT ARMC)
CK, MB: 1.6
Relative Index: 0.9
Total CK: 208
Total CK: 257 — ABNORMAL HIGH

## 2011-06-27 LAB — URINALYSIS, ROUTINE W REFLEX MICROSCOPIC
Bilirubin Urine: NEGATIVE
Glucose, UA: 500 — AB
Ketones, ur: NEGATIVE
pH: 6

## 2011-06-27 LAB — CULTURE, BLOOD (ROUTINE X 2)
Culture: NO GROWTH
Culture: NO GROWTH

## 2011-06-27 LAB — POCT CARDIAC MARKERS
CKMB, poc: 6.6
Troponin i, poc: <0.05

## 2011-06-27 LAB — CK
Total CK: 372 — ABNORMAL HIGH
Total CK: 587 — ABNORMAL HIGH

## 2011-06-27 LAB — ALBUMIN: Albumin: 4.1

## 2011-06-27 LAB — POCT I-STAT CREATININE: Creatinine, Ser: 1

## 2011-06-27 LAB — POTASSIUM: Potassium: 3.7

## 2011-06-27 LAB — OSMOLALITY, URINE: Osmolality, Ur: 695

## 2011-06-27 LAB — DRUG SCREEN PANEL (SERUM)
Amphetamine Scrn: NEGATIVE
Methadone (Dolophine), Serum: NEGATIVE
Opiates, Blood: NEGATIVE
Phencyclidine, Serum: NEGATIVE
Propoxyphene,Serum: NEGATIVE

## 2011-06-27 LAB — CORTISOL-AM, BLOOD: Cortisol - AM: 15.3

## 2011-06-27 LAB — URINE MICROSCOPIC-ADD ON

## 2011-06-27 LAB — CORTISOL: Cortisol, Plasma: 11

## 2011-06-28 LAB — BASIC METABOLIC PANEL
CO2: 25
Calcium: 9.1
GFR calc Af Amer: >60
Glucose, Bld: 107 — ABNORMAL HIGH
Potassium: 3.6
Sodium: 143

## 2011-07-04 LAB — POCT I-STAT CREATININE
Creatinine, Ser: 1.5
Operator id: 284141

## 2011-07-04 LAB — CBC
HCT: 37.5 — ABNORMAL LOW
Hemoglobin: 12.6 — ABNORMAL LOW
MCHC: 33.5
MCV: 90.6
Platelets: 187
RBC: 4.14 — ABNORMAL LOW
RDW: 16.3 — ABNORMAL HIGH
WBC: 14.5 — ABNORMAL HIGH

## 2011-07-04 LAB — URINALYSIS, ROUTINE W REFLEX MICROSCOPIC
Glucose, UA: NEGATIVE
Leukocytes, UA: NEGATIVE
pH: 5.5

## 2011-07-04 LAB — I-STAT 8, (EC8 V) (CONVERTED LAB)
Acid-base deficit: 3 — ABNORMAL HIGH
BUN: 9
Bicarbonate: 21.9
Chloride: 104
Glucose, Bld: 114 — ABNORMAL HIGH
HCT: 43
Hemoglobin: 14.6
Operator id: 284141
Potassium: 3.7
Sodium: 137
TCO2: 23
pCO2, Ven: 36.7 — ABNORMAL LOW
pH, Ven: 7.384 — ABNORMAL HIGH

## 2011-07-04 LAB — DIFFERENTIAL
Basophils Absolute: 0
Basophils Relative: 0
Eosinophils Absolute: 0
Eosinophils Relative: 0
Lymphocytes Relative: 7 — ABNORMAL LOW
Lymphs Abs: 1
Monocytes Absolute: 0.4
Monocytes Relative: 3
Neutro Abs: 13.1 — ABNORMAL HIGH
Neutrophils Relative %: 90 — ABNORMAL HIGH

## 2011-07-04 LAB — URINE MICROSCOPIC-ADD ON

## 2011-07-04 LAB — RAPID URINE DRUG SCREEN, HOSP PERFORMED
Amphetamines: NOT DETECTED
Benzodiazepines: NOT DETECTED
Cocaine: NOT DETECTED

## 2011-07-04 LAB — ETHANOL: Alcohol, Ethyl (B): <5

## 2011-07-04 LAB — PHENYTOIN LEVEL, TOTAL: Phenytoin Lvl: <2.5 — ABNORMAL LOW

## 2015-04-19 ENCOUNTER — Emergency Department
Admission: EM | Admit: 2015-04-19 | Discharge: 2015-04-19 | Disposition: A | Discharge status: Home / Self Care | Payer: Medicaid Other | Attending: Emergency Medicine | Admitting: Emergency Medicine

## 2015-04-19 DIAGNOSIS — N189 Chronic kidney disease, unspecified: Secondary | ICD-10-CM | POA: Insufficient documentation

## 2015-04-19 DIAGNOSIS — Z720 Tobacco use: Secondary | ICD-10-CM | POA: Diagnosis not present

## 2015-04-19 DIAGNOSIS — F919 Conduct disorder, unspecified: Secondary | ICD-10-CM | POA: Insufficient documentation

## 2015-04-19 DIAGNOSIS — F0390 Unspecified dementia without behavioral disturbance: Secondary | ICD-10-CM | POA: Insufficient documentation

## 2015-04-19 DIAGNOSIS — R4689 Other symptoms and signs involving appearance and behavior: Secondary | ICD-10-CM

## 2015-04-19 DIAGNOSIS — F0391 Unspecified dementia with behavioral disturbance: Secondary | ICD-10-CM

## 2015-04-19 DIAGNOSIS — I129 Hypertensive chronic kidney disease with stage 1 through stage 4 chronic kidney disease, or unspecified chronic kidney disease: Secondary | ICD-10-CM | POA: Insufficient documentation

## 2015-04-19 DIAGNOSIS — Z046 Encounter for general psychiatric examination, requested by authority: Secondary | ICD-10-CM | POA: Diagnosis present

## 2015-04-19 DIAGNOSIS — G300 Alzheimer's disease with early onset: Secondary | ICD-10-CM

## 2015-04-19 DIAGNOSIS — F0280 Dementia in other diseases classified elsewhere without behavioral disturbance: Secondary | ICD-10-CM

## 2015-04-19 HISTORY — DX: Unspecified dementia, unspecified severity, without behavioral disturbance, psychotic disturbance, mood disturbance, and anxiety: F03.90

## 2015-04-19 HISTORY — DX: Essential (primary) hypertension: I10

## 2015-04-19 HISTORY — DX: Disorder of kidney and ureter, unspecified: N28.9

## 2015-04-19 LAB — URINE DRUG SCREEN, QUALITATIVE (ARMC ONLY)
Amphetamines, Ur Screen: NOT DETECTED
BARBITURATES, UR SCREEN: NOT DETECTED
Benzodiazepine, Ur Scrn: POSITIVE — AB
COCAINE METABOLITE, UR ~~LOC~~: NOT DETECTED
Cannabinoid 50 Ng, Ur ~~LOC~~: NOT DETECTED
MDMA (ECSTASY) UR SCREEN: NOT DETECTED
METHADONE SCREEN, URINE: NOT DETECTED
Opiate, Ur Screen: NOT DETECTED
PHENCYCLIDINE (PCP) UR S: NOT DETECTED
TRICYCLIC, UR SCREEN: NOT DETECTED

## 2015-04-19 LAB — COMPREHENSIVE METABOLIC PANEL
ALK PHOS: 68 U/L (ref 38–126)
ALT: 14 U/L — ABNORMAL LOW (ref 17–63)
ANION GAP: 8 (ref 5–15)
AST: 20 U/L (ref 15–41)
Albumin: 4.2 g/dL (ref 3.5–5.0)
BUN: 16 mg/dL (ref 6–20)
CALCIUM: 9.4 mg/dL (ref 8.9–10.3)
CO2: 28 mmol/L (ref 22–32)
CREATININE: 1.16 mg/dL (ref 0.61–1.24)
Chloride: 101 mmol/L (ref 101–111)
GFR calc non Af Amer: >60 mL/min (ref >60)
GLUCOSE: 111 mg/dL — AB (ref 65–99)
Potassium: 4 mmol/L (ref 3.5–5.1)
Sodium: 137 mmol/L (ref 135–145)
Total Protein: 7.5 g/dL (ref 6.5–8.1)

## 2015-04-19 LAB — CBC
HCT: 38.9 % — ABNORMAL LOW (ref 40.0–52.0)
Hemoglobin: 12.9 g/dL — ABNORMAL LOW (ref 13.0–18.0)
MCH: 30.5 pg (ref 26.0–34.0)
MCHC: 33.2 g/dL (ref 32.0–36.0)
MCV: 91.7 fL (ref 80.0–100.0)
Platelets: 132 10*3/uL — ABNORMAL LOW (ref 150–440)
RBC: 4.24 MIL/uL — ABNORMAL LOW (ref 4.40–5.90)
RDW: 14.6 % — AB (ref 11.5–14.5)
WBC: 7.3 10*3/uL (ref 3.8–10.6)

## 2015-04-19 LAB — ETHANOL

## 2015-04-19 LAB — ACETAMINOPHEN LEVEL

## 2015-04-19 LAB — SALICYLATE LEVEL: Salicylate Lvl: <4 mg/dL (ref 2.8–30.0)

## 2015-04-19 NOTE — ED Notes (Signed)
Pt watching tv.  Alert.

## 2015-04-19 NOTE — ED Notes (Signed)
Pt waiting on car ride to group home

## 2015-04-19 NOTE — ED Notes (Signed)
Pt eating dinner tray °

## 2015-04-19 NOTE — ED Notes (Signed)
BEHAVIORAL HEALTH ROUNDING Patient sleeping: No. Patient alert and oriented: yes Behavior appropriate: Yes.  ; If no, describe:  Nutrition and fluids offered: Yes  Toileting and hygiene offered: Yes  Sitter present: yes Law enforcement present: Yes  
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
## 2015-04-19 NOTE — Consult Note (Signed)
Per ER Staff(Dr.Paduchowski) the pt. doesn't need to be seen by Behavioral Medicine.

## 2015-04-19 NOTE — Discharge Instructions (Signed)
Dementia °Dementia is a word that is used to describe problems with the brain and how it works. People with dementia have memory loss. They may also have problems with thinking, speaking, or solving problems. It can affect how they act around people, how they do their job, their mood, and their personality. These changes may not show up for a long time. Family or friends may not notice problems in the early part of this disease. °HOME CARE °The following tips are for the person living with, or caring for, the person with dementia. °Make the home safe. °· Remove locks on bathroom doors. °· Use childproof locks on cabinets where alcohol, cleaning supplies, or chemicals are stored. °· Put outlet covers in electrical outlets. °· Put in childproof locks to keep doors and windows safe. °· Remove stove knobs, or put in safety knobs that shut off on their own. °· Lower the temperature on water heaters. °· Label medicines. Lock them in a safe place. °· Keep knives, lighters, matches, power tools, and guns out of reach or in a safe place. °· Remove objects that might break or can hurt the person. °· Make sure lighting is good inside and outside. °· Put in grab bars if needed. °· Use a device that detects falls or other needs for help. °Lessen confusion. °· Keep familiar objects and people around. °· Use night lights or low lit (dim) lights at night. °· Label objects or areas. °· Use reminders, notes, or directions for daily activities or tasks. °· Keep a simple routine that is the same for waking, meals, bathing, dressing, and bedtime. °· Create a calm and quiet home. °· Put up clocks and calendars. °· Keep emergency numbers and the home address near all phones. °· Help show the different times of day. Open the curtains during the day to let light in. °Speak clearly and directly. °· Choose simple words and short sentences. °· Use a gentle, calm voice. °· Do not interrupt. °· If the person has a hard time finding a word to  use, give them the word or thought. °· Ask 1 question at a time. Give enough time for the person to answer. Repeat the question if the person does not answer. °Do things that lessen restlessness. °· Provide a comfortable bed. °· Have the same bedtime routine every night. °· Have a regular walking and activity schedule. °· Lessen naps during the day. °· Do not let the person drink a lot of caffeine. °· Go to events that are not overwhelming. °Eat well and drink fluids. °· Lessen distractions during meal times and snacks. °· Avoid foods that are too hot or too cold. °· Watch how the person chews and swallows. This is to make sure they do not choke. °Other °· Keep all vision, hearing, dental, and medical visits with the doctor. °· Only give medicines as told by the doctor. °· Watch the person's driving ability. Do not let the person drive if he or she cannot drive safely. °· Use a program that helps find a person if they become missing. You may need to register with this program. °GET HELP RIGHT AWAY IF:  °· A fever of 102° F (38.9° C) develops. °· Confusion develops or gets worse. °· Sleepiness develops or gets worse. °· Staying awake is hard to do. °· New behavior problems start like mood swings, aggression, and seeing things that are not there. °· Problems with balance, speech, or falling develop. °· Problems swallowing develop. °· Any   problems of another sickness develop. °MAKE SURE YOU: °· Understand these instructions. °· Will watch his or her condition. °· Will get help right away if he or she is not doing well or gets worse. °Document Released: 08/16/2008 Document Revised: 11/26/2011 Document Reviewed: 01/29/2011 °Exit[employer]® Patient Information ©2015 [employer]. This information is not intended to replace advice given to you by your health care provider. Make sure you discuss any questions you have with your health care provider. ° °

## 2015-04-19 NOTE — ED Notes (Signed)
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
## 2015-04-19 NOTE — ED Notes (Signed)
Dr clapacs in with pt  Now.
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
## 2015-04-19 NOTE — ED Notes (Signed)
esignature done by pt;   D/c inst to given to care giver at time of discharge.  Pt alert. Pt calm and cooperative.
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
## 2015-04-25 ENCOUNTER — Encounter: Payer: Self-pay | Admitting: Emergency Medicine

## 2015-04-25 ENCOUNTER — Emergency Department
Admission: EM | Admit: 2015-04-25 | Discharge: 2015-04-26 | Disposition: A | Discharge status: Other Acute Inpatient Hospital | Payer: Medicaid Other | Attending: Emergency Medicine | Admitting: Emergency Medicine

## 2015-04-25 DIAGNOSIS — F1310 Sedative, hypnotic or anxiolytic abuse, uncomplicated: Secondary | ICD-10-CM | POA: Insufficient documentation

## 2015-04-25 DIAGNOSIS — Z720 Tobacco use: Secondary | ICD-10-CM | POA: Insufficient documentation

## 2015-04-25 DIAGNOSIS — F0281 Dementia in other diseases classified elsewhere with behavioral disturbance: Secondary | ICD-10-CM | POA: Diagnosis not present

## 2015-04-25 DIAGNOSIS — I10 Essential (primary) hypertension: Secondary | ICD-10-CM | POA: Diagnosis not present

## 2015-04-25 DIAGNOSIS — Z880 Allergy status to penicillin: Secondary | ICD-10-CM | POA: Insufficient documentation

## 2015-04-25 DIAGNOSIS — G309 Alzheimer's disease, unspecified: Secondary | ICD-10-CM | POA: Diagnosis not present

## 2015-04-25 DIAGNOSIS — Z79899 Other long term (current) drug therapy: Secondary | ICD-10-CM | POA: Diagnosis not present

## 2015-04-25 DIAGNOSIS — F0391 Unspecified dementia with behavioral disturbance: Secondary | ICD-10-CM

## 2015-04-25 DIAGNOSIS — Z046 Encounter for general psychiatric examination, requested by authority: Secondary | ICD-10-CM | POA: Diagnosis present

## 2015-04-25 LAB — COMPREHENSIVE METABOLIC PANEL
ALK PHOS: 69 U/L (ref 38–126)
ALT: 16 U/L — AB (ref 17–63)
ANION GAP: 10 (ref 5–15)
AST: 25 U/L (ref 15–41)
Albumin: 4.7 g/dL (ref 3.5–5.0)
BUN: 20 mg/dL (ref 6–20)
CALCIUM: 9.9 mg/dL (ref 8.9–10.3)
CO2: 24 mmol/L (ref 22–32)
Chloride: 105 mmol/L (ref 101–111)
Creatinine, Ser: 1.4 mg/dL — ABNORMAL HIGH (ref 0.61–1.24)
GFR calc Af Amer: >60 mL/min (ref >60)
GFR, EST NON AFRICAN AMERICAN: 52 mL/min — AB (ref >60)
Glucose, Bld: 86 mg/dL (ref 65–99)
POTASSIUM: 3.6 mmol/L (ref 3.5–5.1)
SODIUM: 139 mmol/L (ref 135–145)
TOTAL PROTEIN: 8.4 g/dL — AB (ref 6.5–8.1)
Total Bilirubin: 0.5 mg/dL (ref 0.3–1.2)

## 2015-04-25 LAB — CBC
HCT: 41.1 % (ref 40.0–52.0)
HEMOGLOBIN: 13.8 g/dL (ref 13.0–18.0)
MCH: 30.8 pg (ref 26.0–34.0)
MCHC: 33.7 g/dL (ref 32.0–36.0)
MCV: 91.6 fL (ref 80.0–100.0)
Platelets: 129 10*3/uL — ABNORMAL LOW (ref 150–440)
RBC: 4.49 MIL/uL (ref 4.40–5.90)
RDW: 14.7 % — ABNORMAL HIGH (ref 11.5–14.5)
WBC: 9.2 10*3/uL (ref 3.8–10.6)

## 2015-04-25 LAB — URINE DRUG SCREEN, QUALITATIVE (ARMC ONLY)
AMPHETAMINES, UR SCREEN: NOT DETECTED
BENZODIAZEPINE, UR SCRN: POSITIVE — AB
Barbiturates, Ur Screen: NOT DETECTED
Cannabinoid 50 Ng, Ur ~~LOC~~: NOT DETECTED
Cocaine Metabolite,Ur ~~LOC~~: NOT DETECTED
MDMA (Ecstasy)Ur Screen: NOT DETECTED
METHADONE SCREEN, URINE: NOT DETECTED
OPIATE, UR SCREEN: NOT DETECTED
PHENCYCLIDINE (PCP) UR S: NOT DETECTED
TRICYCLIC, UR SCREEN: NOT DETECTED

## 2015-04-25 MED ORDER — PANTOPRAZOLE SODIUM 40 MG PO TBEC
40.0000 mg | DELAYED_RELEASE_TABLET | Freq: Every day | ORAL | Status: DC
  Administered 2015-04-25: 40 mg via ORAL
  Filled 2015-04-25: qty 1

## 2015-04-25 MED ORDER — OLANZAPINE 10 MG PO TABS: 5.0000 mg | ORAL_TABLET | Freq: Every day | ORAL | Status: DC

## 2015-04-25 MED ORDER — SERTRALINE HCL 50 MG PO TABS
25.0000 mg | ORAL_TABLET | Freq: Every day | ORAL | Status: DC
  Administered 2015-04-25: 25 mg via ORAL
  Filled 2015-04-25: qty 1

## 2015-04-25 MED ORDER — RISPERIDONE 1 MG PO TABS
ORAL_TABLET | ORAL | Status: AC
  Filled 2015-04-25: qty 1

## 2015-04-25 MED ORDER — RISPERIDONE 0.5 MG PO TBDP
0.5000 mg | ORAL_TABLET | Freq: Every day | ORAL | Status: DC
  Administered 2015-04-25: 0.5 mg via ORAL
  Filled 2015-04-25 (×2): qty 1

## 2015-04-25 MED ORDER — MEMANTINE HCL 5 MG PO TABS
5.0000 mg | ORAL_TABLET | Freq: Every day | ORAL | Status: DC
  Administered 2015-04-25: 5 mg via ORAL
  Filled 2015-04-25 (×3): qty 1

## 2015-04-25 MED ORDER — LATANOPROST 0.005 % OP SOLN
1.0000 [drp] | Freq: Every day | OPHTHALMIC | Status: DC
  Filled 2015-04-25: qty 2.5

## 2015-04-25 MED ORDER — HYDROCHLOROTHIAZIDE 25 MG PO TABS
12.5000 mg | ORAL_TABLET | Freq: Every day | ORAL | Status: DC
  Administered 2015-04-25: 12.5 mg via ORAL
  Filled 2015-04-25: qty 1

## 2015-04-25 MED ORDER — LISINOPRIL 20 MG PO TABS
20.0000 mg | ORAL_TABLET | Freq: Every day | ORAL | Status: DC
  Administered 2015-04-25: 20 mg via ORAL
  Filled 2015-04-25: qty 1

## 2015-04-25 MED ORDER — VITAMIN B-1 100 MG PO TABS
100.0000 mg | ORAL_TABLET | Freq: Every day | ORAL | Status: DC
  Administered 2015-04-25: 100 mg via ORAL
  Filled 2015-04-25: qty 1

## 2015-04-25 MED ORDER — LEVETIRACETAM 500 MG PO TABS
1000.0000 mg | ORAL_TABLET | Freq: Two times a day (BID) | ORAL | Status: DC
  Administered 2015-04-25 (×2): 1000 mg via ORAL
  Filled 2015-04-25 (×2): qty 2

## 2015-04-25 MED ORDER — LORAZEPAM 0.5 MG PO TABS
0.2500 mg | ORAL_TABLET | Freq: Two times a day (BID) | ORAL | Status: DC
  Administered 2015-04-25 (×2): 0.25 mg via ORAL
  Filled 2015-04-25 (×2): qty 1
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
## 2015-04-25 NOTE — ED Notes (Signed)
BEHAVIORAL HEALTH ROUNDING Patient sleeping: Yes.   Patient alert and oriented: no Behavior appropriate: Yes.  ; If no, describe:  Nutrition and fluids offered: Yes  Toileting and hygiene offered: Yes  Sitter present: no Law enforcement present: Yes  

## 2015-04-25 NOTE — ED Notes (Signed)
BEHAVIORAL HEALTH ROUNDING Patient sleeping: No. Patient alert and oriented: yes Behavior appropriate: Yes.  ;  Nutrition and fluids offered: Yes  Toileting and hygiene offered: Yes  Sitter present: yes Law enforcement present: Yes  
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
## 2015-04-25 NOTE — ED Notes (Signed)
ED BHU PLACEMENT JUSTIFICATION Is the patient under IVC or is there intent for IVC: Yes.   Is the patient medically cleared: Yes.   Is there vacancy in the ED BHU: Yes.   Is the population mix appropriate for patient: Yes.   Is the patient awaiting placement in inpatient or outpatient setting: Yes.   Has the patient had a psychiatric consult: Yes.   Survey of unit performed for contraband, proper placement and condition of furniture, tampering with fixtures in bathroom, shower, and each patient room: Yes.  ; Findings: none APPEARANCE/BEHAVIOR calm, cooperative and adequate rapport can be established NEURO ASSESSMENT Orientation: person Hallucinations: No.None noted (Hallucinations) Speech: Slurred Gait: unsteady RESPIRATORY ASSESSMENT Normal expansion.  Clear to auscultation.  No rales, rhonchi, or wheezing., No chest wall tenderness., No kyphosis or scoliosis. CARDIOVASCULAR ASSESSMENT regular rate and rhythm, S1, S2 normal, no murmur, click, rub or gallop GASTROINTESTINAL ASSESSMENT soft, nontender, BS WNL, no r/g EXTREMITIES normal strength, tone, and muscle mass, no deformities, no erythema, induration, or nodules, no evidence of joint effusion, ROM of all joints is normal PLAN OF CARE Provide calm/safe environment. Vital signs assessed twice daily. ED BHU Assessment once each 12-hour shift. Collaborate with intake RN daily or as condition indicates. Assure the ED provider has rounded once each shift. Provide and encourage hygiene. Provide redirection as needed. Assess for escalating behavior; address immediately and inform ED provider.  Assess family dynamic and appropriateness for visitation as needed: No.; If necessary, describe findings: Family dynamics were not assessed since family is not present  Educate the patient/family about BHU procedures/visitation: Yes.  ; If necessary, describe findings: Pt agrees.

## 2015-04-25 NOTE — ED Notes (Signed)
Pt thinks it is December, does not know why he is here, states he lives alone, has been taking his meds, has hx seizures, denies emotional problems, cooperative, watching tv

## 2015-04-25 NOTE — ED Notes (Signed)
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
## 2015-04-25 NOTE — ED Notes (Signed)
Patient assigned to appropriate care area. Patient oriented to unit/care area: Informed that, for their safety, care areas are designed for safety and monitored by security cameras at all times; and visiting hours explained to patient. Patient verbalizes understanding, and verbal contract for safety obtained. 

## 2015-04-25 NOTE — ED Notes (Signed)
BEHAVIORAL HEALTH ROUNDING Patient sleeping: No. Patient alert and oriented: no Behavior appropriate: Yes.  ; If no, describe:  Nutrition and fluids offered: Yes  Toileting and hygiene offered: Yes  Sitter present: no Law enforcement present: Yes  

## 2015-04-25 NOTE — ED Notes (Signed)
BEHAVIORAL HEALTH ROUNDING Patient sleeping: No. Patient alert and oriented: yes Behavior appropriate: Yes.  ; If no, describe:  Nutrition and fluids offered: Yes  Toileting and hygiene offered: Yes  Sitter present: yes Law enforcement present: Yes  

## 2015-04-25 NOTE — ED Notes (Signed)
Pt to ed ivc from group home with reports of not taking his meds and trying to crawl out the window and walking in the middle of the street.  Pt denies any thoughts of SI or HI.

## 2015-04-25 NOTE — ED Notes (Signed)
Pt states uses a cane, walked in hall with help, gait was slightly unsteady, will not transfer to [facility], md aware

## 2015-04-25 NOTE — ED Notes (Signed)
Unable to get labs, lab called for assistance

## 2015-04-26 ENCOUNTER — Inpatient Hospital Stay
Admit: 2015-04-26 | Discharge: 2015-05-12 | DRG: 884 | Disposition: A | Discharge status: Home / Self Care | Payer: Medicaid Other | Attending: Psychiatry | Admitting: Psychiatry

## 2015-04-26 DIAGNOSIS — G40909 Epilepsy, unspecified, not intractable, without status epilepticus: Secondary | ICD-10-CM | POA: Diagnosis present

## 2015-04-26 DIAGNOSIS — R4587 Impulsiveness: Secondary | ICD-10-CM | POA: Diagnosis present

## 2015-04-26 DIAGNOSIS — Z880 Allergy status to penicillin: Secondary | ICD-10-CM

## 2015-04-26 DIAGNOSIS — Z87820 Personal history of traumatic brain injury: Secondary | ICD-10-CM

## 2015-04-26 DIAGNOSIS — E860 Dehydration: Secondary | ICD-10-CM | POA: Diagnosis present

## 2015-04-26 DIAGNOSIS — K219 Gastro-esophageal reflux disease without esophagitis: Secondary | ICD-10-CM

## 2015-04-26 DIAGNOSIS — G4700 Insomnia, unspecified: Secondary | ICD-10-CM | POA: Diagnosis present

## 2015-04-26 DIAGNOSIS — Z79899 Other long term (current) drug therapy: Secondary | ICD-10-CM

## 2015-04-26 DIAGNOSIS — R451 Restlessness and agitation: Secondary | ICD-10-CM | POA: Diagnosis present

## 2015-04-26 DIAGNOSIS — N179 Acute kidney failure, unspecified: Secondary | ICD-10-CM | POA: Diagnosis present

## 2015-04-26 DIAGNOSIS — A530 Latent syphilis, unspecified as early or late: Secondary | ICD-10-CM | POA: Diagnosis present

## 2015-04-26 DIAGNOSIS — I129 Hypertensive chronic kidney disease with stage 1 through stage 4 chronic kidney disease, or unspecified chronic kidney disease: Secondary | ICD-10-CM | POA: Diagnosis present

## 2015-04-26 DIAGNOSIS — R410 Disorientation, unspecified: Secondary | ICD-10-CM

## 2015-04-26 DIAGNOSIS — G309 Alzheimer's disease, unspecified: Secondary | ICD-10-CM | POA: Diagnosis present

## 2015-04-26 DIAGNOSIS — K08109 Complete loss of teeth, unspecified cause, unspecified class: Secondary | ICD-10-CM

## 2015-04-26 DIAGNOSIS — Z9119 Patient's noncompliance with other medical treatment and regimen: Secondary | ICD-10-CM | POA: Diagnosis present

## 2015-04-26 DIAGNOSIS — F0281 Dementia in other diseases classified elsewhere with behavioral disturbance: Principal | ICD-10-CM | POA: Diagnosis present

## 2015-04-26 DIAGNOSIS — N189 Chronic kidney disease, unspecified: Secondary | ICD-10-CM | POA: Diagnosis present

## 2015-04-26 DIAGNOSIS — A539 Syphilis, unspecified: Secondary | ICD-10-CM

## 2015-04-26 DIAGNOSIS — S069XAA Unspecified intracranial injury with loss of consciousness status unknown, initial encounter: Secondary | ICD-10-CM

## 2015-04-26 DIAGNOSIS — R4781 Slurred speech: Secondary | ICD-10-CM | POA: Diagnosis present

## 2015-04-26 DIAGNOSIS — B370 Candidal stomatitis: Secondary | ICD-10-CM | POA: Diagnosis present

## 2015-04-26 DIAGNOSIS — R1310 Dysphagia, unspecified: Secondary | ICD-10-CM

## 2015-04-26 DIAGNOSIS — F02818 Dementia in other diseases classified elsewhere, unspecified severity, with other behavioral disturbance: Secondary | ICD-10-CM

## 2015-04-26 DIAGNOSIS — F1021 Alcohol dependence, in remission: Secondary | ICD-10-CM

## 2015-04-26 DIAGNOSIS — Z720 Tobacco use: Secondary | ICD-10-CM

## 2015-04-26 DIAGNOSIS — F17200 Nicotine dependence, unspecified, uncomplicated: Secondary | ICD-10-CM

## 2015-04-26 DIAGNOSIS — K000 Anodontia: Secondary | ICD-10-CM

## 2015-04-26 DIAGNOSIS — S069X9A Unspecified intracranial injury with loss of consciousness of unspecified duration, initial encounter: Secondary | ICD-10-CM

## 2015-04-26 LAB — URINALYSIS COMPLETE WITH MICROSCOPIC (ARMC ONLY)
BILIRUBIN URINE: NEGATIVE
Bacteria, UA: NONE SEEN
Bacteria, UA: NONE SEEN
Bilirubin Urine: NEGATIVE
GLUCOSE, UA: NEGATIVE mg/dL
GLUCOSE, UA: NEGATIVE mg/dL
Hgb urine dipstick: NEGATIVE
Hgb urine dipstick: NEGATIVE
Ketones, ur: NEGATIVE mg/dL
Ketones, ur: NEGATIVE mg/dL
Leukocytes, UA: NEGATIVE
Leukocytes, UA: NEGATIVE
Nitrite: NEGATIVE
Nitrite: NEGATIVE
PROTEIN: 30 mg/dL — AB
Protein, ur: 30 mg/dL — AB
RBC / HPF: NONE SEEN RBC/hpf (ref 0–5)
SPECIFIC GRAVITY, URINE: 1.023 (ref 1.005–1.030)
Specific Gravity, Urine: 1.024 (ref 1.005–1.030)
Squamous Epithelial / LPF: NONE SEEN
pH: 6 (ref 5.0–8.0)
pH: 5 (ref 5.0–8.0)

## 2015-04-26 LAB — TSH: TSH: 2.911 u[IU]/mL (ref 0.350–4.500)

## 2015-04-26 LAB — VITAMIN B12: VITAMIN B 12: 495 pg/mL (ref 180–914)

## 2015-04-26 LAB — AMMONIA: Ammonia: 14 umol/L (ref 9–35)

## 2015-04-26 MED ORDER — MAGNESIUM HYDROXIDE 400 MG/5ML PO SUSP: 30.0000 mL | Freq: Every day | ORAL | Status: DC | PRN

## 2015-04-26 MED ORDER — LEVETIRACETAM 500 MG PO TABS
500.0000 mg | ORAL_TABLET | Freq: Two times a day (BID) | ORAL | Status: DC
  Filled 2015-04-26 (×2): qty 1

## 2015-04-26 MED ORDER — OLANZAPINE 5 MG PO TBDP
10.0000 mg | ORAL_TABLET | Freq: Once | ORAL | Status: AC
  Administered 2015-04-26: 10 mg via ORAL
  Filled 2015-04-26: qty 2

## 2015-04-26 MED ORDER — VITAMIN B-1 100 MG PO TABS: 50.0000 mg | ORAL_TABLET | Freq: Every day | ORAL | Status: DC

## 2015-04-26 MED ORDER — NICOTINE 10 MG IN INHA
1.0000 | RESPIRATORY_TRACT | Status: DC | PRN
  Filled 2015-04-26: qty 36

## 2015-04-26 MED ORDER — LEVETIRACETAM 500 MG PO TABS
1000.0000 mg | ORAL_TABLET | Freq: Two times a day (BID) | ORAL | Status: DC
  Administered 2015-04-26 – 2015-05-01 (×11): 1000 mg via ORAL
  Filled 2015-04-26 (×12): qty 2

## 2015-04-26 MED ORDER — LISINOPRIL 5 MG PO TABS: 20.0000 mg | ORAL_TABLET | Freq: Every day | ORAL | Status: DC

## 2015-04-26 MED ORDER — HALOPERIDOL 0.5 MG PO TABS
0.5000 mg | ORAL_TABLET | Freq: Three times a day (TID) | ORAL | Status: DC
  Administered 2015-04-26 – 2015-04-28 (×8): 0.5 mg via ORAL
  Filled 2015-04-26 (×8): qty 1

## 2015-04-26 MED ORDER — LATANOPROST 0.005 % OP SOLN
1.0000 [drp] | Freq: Every day | OPHTHALMIC | Status: DC
  Administered 2015-05-06 – 2015-05-11 (×6): 1 [drp] via OPHTHALMIC
  Filled 2015-04-26 (×3): qty 2.5

## 2015-04-26 MED ORDER — PANTOPRAZOLE SODIUM 40 MG PO TBEC
40.0000 mg | DELAYED_RELEASE_TABLET | Freq: Every day | ORAL | Status: DC
  Administered 2015-04-26 – 2015-05-03 (×8): 40 mg via ORAL
  Filled 2015-04-26 (×9): qty 1

## 2015-04-26 MED ORDER — ALUM & MAG HYDROXIDE-SIMETH 200-200-20 MG/5ML PO SUSP: 30.0000 mL | ORAL | Status: DC | PRN

## 2015-04-26 MED ORDER — OLANZAPINE 5 MG PO TBDP
10.0000 mg | ORAL_TABLET | Freq: Every day | ORAL | Status: DC | PRN
  Administered 2015-04-29: 10 mg via ORAL
  Filled 2015-04-26: qty 2

## 2015-04-26 MED ORDER — ACETAMINOPHEN 325 MG PO TABS: 650.0000 mg | ORAL_TABLET | Freq: Four times a day (QID) | ORAL | Status: DC | PRN

## 2015-04-26 MED ORDER — TRAZODONE HCL 50 MG PO TABS
50.0000 mg | ORAL_TABLET | Freq: Every day | ORAL | Status: DC
  Administered 2015-04-27 – 2015-04-28 (×2): 50 mg via ORAL
  Filled 2015-04-26 (×2): qty 1

## 2015-04-26 MED ORDER — OLANZAPINE 5 MG PO TBDP
10.0000 mg | ORAL_TABLET | Freq: Every day | ORAL | Status: DC
  Administered 2015-04-26: 10 mg via ORAL
  Filled 2015-04-26: qty 2

## 2015-04-26 MED ORDER — TRAZODONE HCL 50 MG PO TABS: 50.0000 mg | ORAL_TABLET | Freq: Three times a day (TID) | ORAL | Status: DC

## 2015-04-26 MED ORDER — NICOTINE 21 MG/24HR TD PT24
21.0000 mg | MEDICATED_PATCH | Freq: Every day | TRANSDERMAL | Status: DC
  Administered 2015-04-26 – 2015-05-12 (×17): 21 mg via TRANSDERMAL
  Filled 2015-04-26 (×17): qty 1

## 2015-04-26 MED ORDER — CHLORPROMAZINE HCL 50 MG PO TABS
50.0000 mg | ORAL_TABLET | ORAL | Status: DC | PRN
  Filled 2015-04-26: qty 1

## 2015-04-26 MED ORDER — LORAZEPAM 0.5 MG PO TABS
0.5000 mg | ORAL_TABLET | Freq: Three times a day (TID) | ORAL | Status: DC
  Administered 2015-04-26 – 2015-04-27 (×3): 0.5 mg via ORAL
  Filled 2015-04-26 (×3): qty 1

## 2015-04-26 MED ORDER — CHLORPROMAZINE HCL 25 MG/ML IJ SOLN
50.0000 mg | INTRAMUSCULAR | Status: DC | PRN
  Administered 2015-04-26: 50 mg via INTRAMUSCULAR
  Filled 2015-04-26 (×2): qty 2

## 2015-04-26 MED ORDER — HYDROCHLOROTHIAZIDE 12.5 MG PO CAPS: 12.5000 mg | ORAL_CAPSULE | Freq: Every day | ORAL | Status: DC

## 2015-04-26 NOTE — [facility] Notes (Signed)
Urine for UA C&S obtained via in and out cath per MD order.

## 2015-04-26 NOTE — [facility] Notes (Signed)
Patient woke long enough to eat some lunch. He does not have teeth so he is unable to chew food. He ate some hamburger/bun, applesauce and mashed potatoes. Has been encouraged to drink fluids today but has only taken in about 100 ml of Gatorade. After lunch, he asked for a cigarette. Will notify MD for Nicotrol inhaler. He has remained in bed all day except when staff assisted him to [facility] for BM. Mood calm and cooperative. No evidence of psychosis. Will continue to monitor.

## 2015-04-26 NOTE — BHH Suicide Risk Assessment (Signed)
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 HTN (hypertension) [I10] 04/26/2015  . GERD (gastroesophageal reflux disease) [K21.9] 04/26/2015  . TBI (traumatic brain injury) [S06.9X0A] 04/26/2015  . Alcohol use disorder, severe, in sustained remission, in controlled environment [F10.99] 04/26/2015  . 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 Acute delirium [R41.0] 04/26/2015  . Acute kidney failure [N17.9] 04/26/2015     Continued Clinical Symptoms:  Alcohol Use Disorder Identification Test Final Score (AUDIT): 1 The "Alcohol Use Disorders Identification Test", Guidelines for Use in Primary Care, Second Edition.  World Science writer [facility]). Score between 0-7:  no or low risk or alcohol related problems. Score between 8-15:  moderate risk of alcohol related problems. Score between 16-19:  high risk of alcohol related problems. Score 20 or above:  warrants further diagnostic evaluation for alcohol dependence and treatment.   CLINICAL FACTORS:   Severe Anxiety and/or Agitation Epilepsy Medical Diagnoses and Treatments/Surgeries   Psychiatric Specialty Exam: Physical Exam  ROS    COGNITIVE FEATURES THAT CONTRIBUTE TO RISK:  Loss of executive function    SUICIDE RISK:   Mild:  Suicidal ideation of limited frequency, intensity, duration, and specificity.  There are no identifiable plans, no associated intent, mild dysphoria and related symptoms, good self-control (both  objective and subjective assessment), few other risk factors, and identifiable protective factors, including available and accessible social support.  PLAN OF CARE: admit to [facility]  Medical Decision Making:  Established Problem, Worsening (2)  I certify that inpatient services furnished can reasonably be expected to improve the patient's condition.   Jimmy Footman 04/26/2015, 9:54 AM
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
## 2015-04-26 NOTE — [facility] Notes (Signed)
Contacted by Cristela Felt, RN. States she in and out urinary cathed patient, did not get any urine. Requested a bladder scan. Scan resulted 224 cc of urine. RN present in room, aware of result.

## 2015-04-26 NOTE — Plan of Care (Signed)
Problem: Alteration in thought process Goal: LTG-Patient is able to perceive the environment accurately Outcome: Not Progressing Patient is very lethargic from medications but does not understand he is not able to walk about as he previously had. He is using wheelchair for mobility and needs frequent reminders not too walk. Has 1:1 Recruitment consultant.
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
## 2015-04-26 NOTE — ED Notes (Signed)
BEHAVIORAL HEALTH ROUNDING  Patient sleeping: Yes.  Patient alert and oriented: Pt is sleeping  Behavior appropriate: Yes. ; If no, describe: Pt is sleeping  Nutrition and fluids offered: Pt is sleeping  Toileting and hygiene offered: Pt is sleeping  Sitter present: yes  Law enforcement present: Yes   
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
## 2015-04-26 NOTE — Progress Notes (Signed)
Pt was placed in bed in room 302 in his scrubs.  Patient exited room, fully dressed, and entered into room 301.  Patient is trying to find ways to open the window.

## 2015-04-26 NOTE — Tx Team (Signed)
Initial Interdisciplinary Treatment Plan   PATIENT STRESSORS: Medication change or noncompliance   PATIENT STRENGTHS: Religious Affiliation Supportive family/friends   PROBLEM LIST: Problem List/Patient Goals Date to be addressed Date deferred Reason deferred Estimated date of resolution  Confusion 04/26/15     Aggression 04/26/15                                                DISCHARGE CRITERIA:  Improved stabilization in mood, thinking, and/or behavior  PRELIMINARY DISCHARGE PLAN: Outpatient therapy  PATIENT/FAMIILY INVOLVEMENT: This treatment plan has been presented to and reviewed with the patient, Alex Waters, and/or family member.  The patient and family have been given the opportunity to ask questions and make suggestions.  Gretta Arab [facility] 04/26/2015, 1:56 AM

## 2015-04-26 NOTE — Progress Notes (Signed)
Patient was found lying on floor by his bed by staff doing 15 minute rounds at about 0720. He was mute but followed basic commands. No injury was apparent. He was assisted back to his bed and has been lying still since then. He is lethargic and unable to swallow liquids due to sedation. Dr. Ardyth Harps paged. Exit alarm placed on patient. Will continue to monitor.

## 2015-04-26 NOTE — Progress Notes (Signed)
Recreation Therapy Notes  Date: 08.09.16 Time: 3:00 pm Location: Craft Room  Group Topic: Goal Setting  Goal Area(s) Addresses:  Patients will write at least one goal. Patients will write at least one obstacle.  Behavioral Response: Did not attend  Intervention: Recovery Goal Chart  Activity: Patients were instructed to make a Recovery Goal Chart listing goals, obstacles, the date they started working on their goal and the date they completed their goal.  Education: LRT educated patients on healthy ways to celebrate reaching their goals.   Education Outcome: Patient did not attend group.  Clinical Observations/Feedback: Patient did not attend group.  Jacquelynn Cree, LRT/CTRS 04/26/2015 4:05 PM

## 2015-04-26 NOTE — Progress Notes (Signed)
Patient removed the baseboard molding in room 301.

## 2015-04-26 NOTE — [facility] Group Notes (Signed)
[facility] Group Notes:  (Nursing/MHT/Case Management/Adjunct)  Date:  04/26/2015  Time:  1:59 PM  Type of Therapy:  Psychoeducational Skills  Participation Level:  Did Not Attend   Participation Quality:  n/a  Affect:  n/a  Cognitive:  n/a  Insight:  None  Engagement in Group:  n/a  Modes of Intervention:  n/a  Summary of Progress/Problems:  Alex Waters 04/26/2015, 1:59 PM

## 2015-04-27 DIAGNOSIS — F17200 Nicotine dependence, unspecified, uncomplicated: Secondary | ICD-10-CM

## 2015-04-27 LAB — RPR: RPR Ser Ql: REACTIVE — AB

## 2015-04-27 LAB — HIV ANTIBODY (ROUTINE TESTING W REFLEX): HIV SCREEN 4TH GENERATION: NONREACTIVE

## 2015-04-27 LAB — RPR, QUANT+TP ABS (REFLEX)
Rapid Plasma Reagin, Quant: 1:4 {titer} — ABNORMAL HIGH
TREPONEMA PALLIDUM AB: POSITIVE — AB

## 2015-04-27 MED ORDER — CLONAZEPAM 0.5 MG PO TABS
0.2500 mg | ORAL_TABLET | Freq: Three times a day (TID) | ORAL | Status: DC
  Administered 2015-04-27 – 2015-04-28 (×5): 0.25 mg via ORAL
  Filled 2015-04-27 (×5): qty 1

## 2015-04-27 NOTE — [facility] Group Notes (Signed)
[facility] LCSW Group Therapy  04/27/2015 2:38 PM  Type of Therapy:  Group Therapy  Participation Level:  Did Not Attend  Modes of Intervention:  Discussion, Education, Socialization and Support  Summary of Progress/Problems: Emotional Regulation: Patients will identify both negative and positive emotions. They will discuss emotions they have difficulty regulating and how they impact their lives. Patients will be asked to identify healthy coping skills to combat unhealthy reactions to negative emotions.     [employer] MSW, LCSWA  04/27/2015, 2:38 PM

## 2015-04-27 NOTE — Progress Notes (Signed)
[facility] MD Progress Note  04/27/2015 11:22 AM Alex Waters  MRN:  [id number] Subjective:  Patient states that he is here in the hospital because his back was hurting and somebody brought him to be seen.  Patient says he doesn't know where he lives. He tells me it is December 1993.  He denies depression, problems with his sleep, appetite, energy or concentration. He denies problems with memory. He denies having SI, HI or auditory or visual hallucinations.  Per nursing no agitation overnight however he has been overly sedated seems Monday night after receiving when necessary Thorazine and olanzapine.  Per nursing yesterday: Patient is very lethargic from medications but does not understand he is not able to walk about as he previously had. He is using wheelchair for mobility and needs frequent reminders not too walk. Has 1:1 Recruitment consultant. Patient affect is blunted. Mood is depressed. Patient did NOT attend evening group. Patient isolative throughout the shift. No distress noted.  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 Tobacco use disorder [Z72.0] 04/27/2015  . HTN (hypertension) [I10] 04/26/2015  . GERD (gastroesophageal reflux disease) [K21.9] 04/26/2015  . TBI (traumatic brain injury) [S06.9X0A] 04/26/2015  . Alcohol use disorder, severe, in sustained remission, in controlled environment [F10.99] 04/26/2015  . 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 Acute delirium [R41.0] 04/26/2015  . Acute kidney failure [N17.9] 04/26/2015   Total Time spent with patient: 30 minutes   Past Medical History:  Past Medical History  Diagnosis Date  . Renal disorder   . Hypertension   . Dementia    History reviewed. No pertinent past surgical history. Family History: History reviewed. No pertinent family  history. Social History:  History  Alcohol Use  . Yes    Comment: rarely     History  Drug Use No    Social History   Social History  . Marital Status: Single    Spouse Name: N/A  . Number of Children: N/A  . Years of Education: N/A   Social History Main Topics  . Smoking status: Former Smoker    Types: Cigarettes  . Smokeless tobacco: Never Used  . Alcohol Use: Yes     Comment: rarely  . Drug Use: No  . Sexual Activity: Not Currently   Other Topics Concern  . None   Social History Narrative   Additional History:    Sleep: Good  Appetite:  Fair   Assessment:   Musculoskeletal: Strength & Muscle Tone: within normal limits Gait & Station: normal Patient leans: N/A   Psychiatric Specialty Exam: Physical Exam  Review of Systems  Constitutional: Negative.   HENT: Negative.   Eyes: Negative.   Respiratory: Negative.   Cardiovascular: Negative.   Gastrointestinal: Negative.   Genitourinary: Negative.   Musculoskeletal: Negative.   Skin: Negative.   Neurological: Negative.   Endo/Heme/Allergies: Negative.   Psychiatric/Behavioral: Negative.     Blood pressure 81/52, pulse 94, temperature 98.8 F (37.1 C), temperature source Oral, resp. rate 18, height  (1.676 m), weight 58.06 kg (128 lb), SpO2 100 %.Body mass index is 20.67 kg/(m^2).  General Appearance: Fairly Groomed  Patent attorney::  Minimal  Speech:  Slow  Volume:  Decreased  Mood:  Dysphoric  Affect:  Blunt  Thought Process:  concrete  Orientation:  Other:  Not oriented to place, situation or time  Thought  Content:  Hallucinations: None  Suicidal Thoughts:  No  Homicidal Thoughts:  No  Memory:  Immediate;   Poor Recent;   Poor Remote;   Poor  Judgement:  Poor  Insight:  Lacking  Psychomotor Activity:  Decreased  Concentration:  Fair  Recall:  NA  Fund of Knowledge:Poor  Language: Poor  Akathisia:  No  Handed:    AIMS (if indicated):     Assets:  Financial  Resources/Insurance Housing Social Support  ADL's:  Intact  Cognition: Impaired,  Severe  Sleep:  Number of Hours: 8.25     Current Medications: Current Facility-Administered Medications  Medication Dose Route Frequency Provider Last Rate Last Dose  . acetaminophen (TYLENOL) tablet 650 mg  650 mg Oral Q6H PRN Jolanta B Pucilowska, MD      . alum & mag hydroxide-simeth (MAALOX/MYLANTA) 200-200-20 MG/5ML suspension 30 mL  30 mL Oral Q4H PRN Jolanta B Pucilowska, MD      . clonazePAM (KLONOPIN) tablet 0.25 mg  0.25 mg Oral TID AC & HS Jimmy Footman, MD      . haloperidol (HALDOL) tablet 0.5 mg  0.5 mg Oral TID AC & HS Jimmy Footman, MD   0.5 mg at 04/27/15 0743  . latanoprost (XALATAN) 0.005 % ophthalmic solution 1 drop  1 drop Both Eyes QHS Shari Prows, MD   1 drop at 04/26/15 2108  . levETIRAcetam (KEPPRA) tablet 1,000 mg  1,000 mg Oral BID Jimmy Footman, MD   1,000 mg at 04/27/15 0919  . magnesium hydroxide (MILK OF MAGNESIA) suspension 30 mL  30 mL Oral Daily PRN Jolanta B Pucilowska, MD      . nicotine (NICODERM CQ - dosed in mg/24 hours) patch 21 mg  21 mg Transdermal Daily Jimmy Footman, MD   21 mg at 04/27/15 0918  . nicotine (NICOTROL) 10 MG inhaler 1 continuous puffing  1 continuous puffing Inhalation PRN Jimmy Footman, MD      . OLANZapine zydis (ZYPREXA) disintegrating tablet 10 mg  10 mg Oral Daily PRN Jolanta B Pucilowska, MD      . pantoprazole (PROTONIX) EC tablet 40 mg  40 mg Oral Daily Jolanta B Pucilowska, MD   40 mg at 04/27/15 0919  . traZODone (DESYREL) tablet 50 mg  50 mg Oral QHS Jimmy Footman, MD        Lab Results:  Results for orders placed or performed during the hospital encounter of 04/26/15 (from the past 48 hour(s))  TSH     Status: None   Collection Time: 04/26/15 11:46 AM  Result Value Ref Range   TSH 2.911 0.350 - 4.500 uIU/mL  Ammonia     Status: None   Collection Time:  04/26/15 11:46 AM  Result Value Ref Range   Ammonia 14 9 - 35 umol/L  Vitamin B12     Status: None   Collection Time: 04/26/15 11:46 AM  Result Value Ref Range   Vitamin B-12 495 180 - 914 pg/mL    Comment: (NOTE) This assay is not validated for testing neonatal or myeloproliferative syndrome specimens for Vitamin B12 levels. Performed at [facility]   Urinalysis complete, with microscopic [facility] only)     Status: Abnormal   Collection Time: 04/26/15 12:05 PM  Result Value Ref Range   Color, Urine YELLOW (A) YELLOW   APPearance CLEAR (A) CLEAR   Glucose, UA NEGATIVE NEGATIVE mg/dL   Bilirubin Urine NEGATIVE NEGATIVE   Ketones, ur NEGATIVE NEGATIVE mg/dL   Specific Gravity, Urine  1.023 1.005 - 1.030   Hgb urine dipstick NEGATIVE NEGATIVE   pH 5.0 5.0 - 8.0   Protein, ur 30 (A) NEGATIVE mg/dL   Nitrite NEGATIVE NEGATIVE   Leukocytes, UA NEGATIVE NEGATIVE   RBC / HPF NONE SEEN 0 - 5 RBC/hpf   WBC, UA 0-5 0 - 5 WBC/hpf   Bacteria, UA NONE SEEN NONE SEEN   Squamous Epithelial / LPF NONE SEEN NONE SEEN   Mucous PRESENT    Hyaline Casts, UA PRESENT     Physical Findings: AIMS: Facial and Oral Movements Muscles of Facial Expression: None, normal Lips and Perioral Area: None, normal Jaw: None, normal Tongue: None, normal,Extremity Movements Upper (arms, wrists, hands, fingers): None, normal Lower (legs, knees, ankles, toes): None, normal, Trunk Movements Neck, shoulders, hips: None, normal, Overall Severity Severity of abnormal movements (highest score from questions above): None, normal Incapacitation due to abnormal movements: None, normal Patient's awareness of abnormal movements (rate only patient's report): No Awareness, Dental Status Current problems with teeth and/or dentures?: No Does patient usually wear dentures?: No  CIWA:    COWS:     Treatment Plan Summary: Daily contact with patient to assess and evaluate symptoms and progress in treatment and  Medication management   64 year old with history of dementia, alcohol abuse, possible traumatic brain injury who has been living at a group home in [place] for the last 3 weeks, patient was brought in due to severe agitation and behavioral disturbances at the group home. The patient was trying to elope the facility through a window. The patient was admitted for the workup but in the evenings became severely agitated and required multiple medications to address his behavior problems.   Traumatic brain injury dementia: patient continues to be overly sedated likely due to the increase on olanzapine, and the prn medications he received on Monday night for severe agitation.  I we will discontinue olanzapine and only use it as a when necessary for agitation  Acute delirium and agitation: Haldol 0.5 mg qid  Seizure disorder: continue Keppra thousand milligrams by mouth twice a day  Chronic use of benzodiazepine: patient was receiving Ativan 1 mg 3 times a day prior to admission and also Valium twice a day when necessary. To prevent benzodiazepine withdrawal I will continue benzodiazepine.  Today I we'll discontinue the Ativan and continue the benzodiazepine taper with clonazepam 0.25 mg 4 times a day  Hypertension: I'm going to discontinue  hydrochlorothiazide 12.5 mg daily and lisinopril 20 mg by mouth daily as patient has been hypotensive  GERD continue Protonix 40 mg by mouth daily  Tobacco use disorder: yesterday patient requested nicotine patch. He has been started on 21 mg patch.  Dehydration and acute renal insufficiency: continue pushing fluids  Labs: B-12, TSH and ammonia levels are within the normal limits. UA appears clear with no evidence of infection. HIV and RPR are pending  Precautions: today her level of agitation how we'll increase his supervision to one-to-one  Discharge planning: once stable the patient will be discharged back to the facility he was staying at prior to  admission.  Medical Decision Making:  Established Problem, Stable/Improving (1)     Jimmy Footman 04/27/2015, 11:22 AM

## 2015-04-27 NOTE — Progress Notes (Signed)
D) Patient remains withdrawn the majority of the day. He has limited ability to communicate at this time, mumbling and responding to answers with  Head nods.  He is oriented to person only. Appears more responsive then previous days.He was assisted with ADL's including bathing and changing clothes/linen. Appetite and fluid intake improving eating 75% of breakfast and lunch independently. He was present in the dinning room during lunch. His concentration and memory a poor. He  denies suicidal ideations, denies thoughts to harm self or others. No observable signs of him responding to internal stimuli or symptoms associated with psychosis.  A) Staff continues to provide safe therapeutic environment. He is provided with medication as ordered. He is given education on prescribed medication and importance of maintaining hygiene. He is provided with fall and safety precaution education but requires consistent reinforcement and reminders. He denies pain and has no other physical complaints at this time.  R) Patient is receptive to all education and needs additional reinforcement. He remains on 1:1 monitoring for safety and remains safe in the milieu.

## 2015-04-27 NOTE — Progress Notes (Signed)
Recreation Therapy Notes  Date: 08.10.16 Time: 3:00 pm Location: Craft Room  Group Topic: Self-esteem  Goal Area(s) Addresses:  Patient will write down at least one positive trait about self.  Behavioral Response: Did not attend  Intervention: I Am  Activity: Patients were given a worksheet with the letter I on it and instructed to list as many positive traits as they could inside the I.   Education: LRT educated patients on ways they can increase their self-esteem.  Education Outcome: Patient did not attend group.  Clinical Observations/Feedback: Patient did not attend group.  Jacquelynn Cree, LRT/CTRS 04/27/2015 4:11 PM

## 2015-04-27 NOTE — Progress Notes (Addendum)
Patient assisted with ADL's. He received assistance with bathing and changing of clothes and linen. He ate 75% of breakfast. He continues on 1:1 for fall precautions and has been provided with fall precaution education.

## 2015-04-27 NOTE — Plan of Care (Signed)
Problem: Consults Goal: [facility] General Treatment Patient Education Outcome: Progressing Patient provided with hygiene education, medication education including importance of compliance and safety education which are all part of his treatment plan.   Problem: Consults Goal: Psychosis Patient Education See Patient Education Module for education specifics.  Outcome: Progressing Patient provided with medication education and symptoms of psychosis. Further teaching required.   Problem: Alteration in thought process Goal: LTG-Patient has not harmed self or others in at least 2 days Outcome: Progressing Patient has not harmed himself.  Goal: LTG-Patient behavior demonstrates decreased signs psychosis (Patient behavior demonstrates decreased signs of psychosis to the point the patient is safe to return home and continue treatment in an outpatient setting.)  Outcome: Progressing Disorganized. Alert to person only. Continue to reorient. He remains on 1:1 for safety.   Problem: Consults Goal: Aggression Patient Education See Patient Education Module for education specifics.  Outcome: Progressing No aggression towards others.   Problem: Aggression Towards others,Towards Self, and or Destruction Goal: LTG - No aggression,physical/verbal/destruction prior to D/C (Patient will have no episodes of physical or verbal aggression or property destruction towards self or others for _____ day (s) prior to discharge.)  Outcome: Progressing No phsycial aggression towards property. He remains in bed the majority of the day. He is able to follow directions related to safety awareness.

## 2015-04-27 NOTE — [facility] Group Notes (Signed)
Provided 1:1 educational group activity with patient. Provided hygiene education and discussed importance of maintaining healthy activities of daily living. Patient had limited participation. Staff will continue to support and encourage patient participation in groups as he progress in treatment

## 2015-04-27 NOTE — Progress Notes (Signed)
D:  Patient affect is blunted. Mood is depressed.  Patient did NOT attend evening group. Patient isolative throughout the shift. No distress noted. A: Support and encouragement offered. Scheduled medications given to pt. 1:1 safety observation continued for patient safety. R: Patient receptive. Patient remains safe on the unit.

## 2015-04-27 NOTE — [facility] Group Notes (Signed)
[facility] LCSW Aftercare Discharge Planning Group Note   04/27/2015 10:38 AM  Participation Quality:   Did not attend group    Lulu Riding, MSW, LCSWA

## 2015-04-27 NOTE — [facility] Group Notes (Signed)
[facility] Group Notes:  (Nursing/MHT/Case Management/Adjunct)  Date:  04/27/2015  Time:  1:32 PM  Type of Therapy:  Psychoeducational Skills  Participation Level:  Did Not Attend    Lynelle Smoke [facility] 04/27/2015, 1:32 PM

## 2015-04-27 NOTE — Tx Team (Signed)
Interdisciplinary Treatment Plan Update (Adult)  Date:  04/27/2015 Time Reviewed:  11:04 AM  Progress in Treatment: Attending groups: No. Participating in groups:  No. Taking medication as prescribed:  Yes. Tolerating medication:  Yes. Family/Significant othe contact made:  No, will contact:  if patient provides consent Patient understands diagnosis:  No. Discussing patient identified problems/goals with staff:  Yes. Medical problems stabilized or resolved:  Yes. Denies suicidal/homicidal ideation: Yes. Issues/concerns per patient self-inventory:  No. Other:  New problem(s) identified: No, Describe:  none reported  Discharge Plan or Barriers: Patient will stabilize on meds and discharge back to ALF  Reason for Continuation of Hospitalization: Mania  Comments:  Estimated length of stay: expected discharge Tuesday 05/03/15  New goal(s):  Review of initial/current patient goals per problem list:   See Care Plan  Attendees: Physician:  Radene Journey, MD 8/10/201611:04 AM  Nursing:   Cristela Felt, RN 8/10/201611:04 AM  Other:  Beryl Meager, LCSWA 8/10/201611:04 AM  Other:   8/10/201611:04 AM  Other:   8/10/201611:04 AM  Other:  8/10/201611:04 AM  Other:  8/10/201611:04 AM  Other:  8/10/201611:04 AM  Other:  8/10/201611:04 AM  Other:  8/10/201611:04 AM  Other:  8/10/201611:04 AM  Other:   8/10/201611:04 AM   Scribe for Treatment Team:   Lulu Riding, MSW, LCSWA  04/27/2015, 11:04 AM

## 2015-04-28 LAB — URINE CULTURE: CULTURE: NO GROWTH

## 2015-04-28 LAB — BASIC METABOLIC PANEL
Anion gap: 6 (ref 5–15)
BUN: 27 mg/dL — ABNORMAL HIGH (ref 6–20)
CO2: 29 mmol/L (ref 22–32)
Calcium: 9.5 mg/dL (ref 8.9–10.3)
Chloride: 105 mmol/L (ref 101–111)
Creatinine, Ser: 1.54 mg/dL — ABNORMAL HIGH (ref 0.61–1.24)
GFR, EST AFRICAN AMERICAN: 54 mL/min — AB (ref >60)
GFR, EST NON AFRICAN AMERICAN: 46 mL/min — AB (ref >60)
Glucose, Bld: 108 mg/dL — ABNORMAL HIGH (ref 65–99)
POTASSIUM: 4.3 mmol/L (ref 3.5–5.1)
Sodium: 140 mmol/L (ref 135–145)

## 2015-04-28 MED ORDER — CLONAZEPAM 0.5 MG PO TABS: 0.2500 mg | ORAL_TABLET | Freq: Three times a day (TID) | ORAL | Status: DC

## 2015-04-28 MED ORDER — TUBERCULIN PPD 5 UNIT/0.1ML ID SOLN: 5.0000 [IU] | Freq: Once | INTRADERMAL | Status: DC

## 2015-04-28 MED ORDER — HALOPERIDOL 0.5 MG PO TABS: 0.5000 mg | ORAL_TABLET | Freq: Three times a day (TID) | ORAL | Status: DC

## 2015-04-28 MED ORDER — TRAZODONE HCL 50 MG PO TABS: 50.0000 mg | ORAL_TABLET | Freq: Every day | ORAL | Status: DC

## 2015-04-28 MED ORDER — CLONAZEPAM 0.5 MG PO TABS
0.2500 mg | ORAL_TABLET | Freq: Three times a day (TID) | ORAL | Status: DC
  Administered 2015-04-28 – 2015-05-03 (×15): 0.25 mg via ORAL
  Filled 2015-04-28 (×15): qty 1

## 2015-04-28 MED ORDER — TUBERCULIN PPD 5 UNIT/0.1ML ID SOLN
5.0000 [IU] | Freq: Once | INTRADERMAL | Status: AC
Start: 2015-04-28 — End: 2015-04-30
  Administered 2015-04-28: 5 [IU] via INTRADERMAL
  Filled 2015-04-28 (×2): qty 0.1

## 2015-04-28 MED ORDER — HALOPERIDOL 0.5 MG PO TABS
0.5000 mg | ORAL_TABLET | Freq: Three times a day (TID) | ORAL | Status: DC
  Administered 2015-04-28 (×2): 0.5 mg via ORAL
  Filled 2015-04-28 (×3): qty 1

## 2015-04-28 MED ORDER — OLANZAPINE 10 MG PO TBDP: 10.0000 mg | ORAL_TABLET | Freq: Every day | ORAL | Status: DC | PRN
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
## 2015-04-28 NOTE — [facility] Group Notes (Signed)
[facility] LCSW Group Therapy  04/28/2015 12:29 PM  Type of Therapy: Group Therapy  Participation Level: Did Not Attend  Modes of Intervention: Discussion, Education, Socialization and Support  Summary of Progress/Problems:Balance in life: Patients will discuss the concept of balance and how it looks and feels to be unbalanced. Pt will identify areas in their life that is unbalanced and ways to become more balanced.    Graciano Batson L Treylin Burtch MSW, LCSWA  04/28/2015, 12:29 PM 
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
## 2015-04-28 NOTE — [facility] Suicide Risk Assessment (Signed)
[facility] Discharge Suicide Risk Assessment   Demographic Factors:  Male  Total Time spent with patient: 30 minutes   Psychiatric Specialty Exam: Physical Exam  ROS                                                         Have you used any form of tobacco in the last 30 days? (Cigarettes, Smokeless Tobacco, Cigars, and/or Pipes): No  Has this patient used any form of tobacco in the last 30 days? (Cigarettes, Smokeless Tobacco, Cigars, and/or Pipes) Yes, A prescription for an FDA-approved tobacco cessation medication was offered at discharge and the patient refused  Mental Status Per Nursing Assessment::   On Admission:  NA  Current Mental Status by Physician: no evidence of agitation, delirium.  Cognition appears at baseline  Loss Factors: NA  Historical Factors: NA  Risk Reduction Factors:   Living with another person, especially a relative and Positive social support  Continued Clinical Symptoms:  Previous Psychiatric Diagnoses and Treatments Medical Diagnoses and Treatments/Surgeries  Cognitive Features That Contribute To Risk:  Closed-mindedness    Suicide Risk:  Minimal: No identifiable suicidal ideation.  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 + RPR test (pending FTA) [Z92.89] 04/28/2015  . Tobacco use disorder [Z72.0] 04/27/2015  . HTN (hypertension) [I10] 04/26/2015  . GERD (gastroesophageal reflux disease) [K21.9] 04/26/2015  . TBI (traumatic brain injury) [S06.9X0A] 04/26/2015  . Alcohol use disorder, severe, in sustained remission, in controlled environment [F10.99] 04/26/2015  . Major neurocognitive disorder due to multiple etiologies with  behavioral disturbance [F02.81] 04/26/2015  . Acute delirium [R41.0] 04/26/2015  . Acute kidney failure [N17.9] 04/26/2015       Is patient on multiple antipsychotic therapies at discharge:  Yes,   Do you recommend tapering to monotherapy for antipsychotics?  second antipsychotic will be only be used in case of severe agitation   Has Patient had three or more failed trials of antipsychotic monotherapy by history:  No  Recommended Plan for Multiple Antipsychotic Therapies: NA    Alex Waters 04/28/2015, 10:00 AM

## 2015-04-28 NOTE — Progress Notes (Signed)
Calm and cooperative. Pt remains 1:1 for safety. Able to follow simple commands. tol PO morning medications well. PPD given to left FA, as ordered. Pt remains in bed. Unsteady gait noted. amb via w/c. Marland KitchenNo behavior problems noted. Denies SI/HI. No AV/H noted. Will continue to monitor for safety and behavior.

## 2015-04-28 NOTE — Plan of Care (Signed)
Problem: Ineffective individual coping Goal: STG: Pt will be able to identify effective and ineffective STG: Pt will be able to identify effective and ineffective coping patterns  Outcome: Not Progressing Calm and cooperative. Pt remains 1:1 for safety. Limited interaction with peers and staff. No behavior problems noted. Denies SI/HI.

## 2015-04-28 NOTE — [facility] Group Notes (Signed)
[facility] Group Notes:  (Nursing/MHT/Case Management/Adjunct)  Date:  04/28/2015  Time:  1:38 PM  Type of Therapy:  Psychoeducational Skills  Participation Level:  Did Not Attend   Alex Waters 04/28/2015, 1:38 PM

## 2015-04-28 NOTE — Progress Notes (Signed)
Recreation Therapy Notes  According to patient's doctor, patient is disoriented x3. LRT will not attempt assessment at this time.  Jacquelynn Cree, LRT/CTRS 04/28/2015 4:43 PM

## 2015-04-28 NOTE — [facility] Group Notes (Signed)
[facility] Group Notes:  (Nursing/MHT/Case Management/Adjunct)  Date:  04/28/2015  Time:  8:43 AM  Type of Therapy:  Community Meeting  Participation Level:  Did Not Attend  Summary of Progress/Problems:  Alex Waters Alex Waters 04/28/2015, 8:43 AM

## 2015-04-28 NOTE — Progress Notes (Signed)
Recreation Therapy Notes  Date: 08.11.16 Time: 3:00 pm Location: Craft Room  Group Topic: Leisure Education  Goal Area(s) Addresses:  Patient will participate in leisure activity. Patient will verbalize one emotion experienced during leisure activity.  Behavioral Response: Did not attend  Intervention: Coloring  Activity: Patients were given coloring sheets and instructed to color.  Education: LRT educated patients on the importance of participating in leisure.  Education Outcome: Patient did not attend group.  Clinical Observations/Feedback: Patient did not attend group.  Jacquelynn Cree, LRT/CTRS 04/28/2015 4:14 PM

## 2015-04-28 NOTE — Progress Notes (Signed)
D: Patient affect is blunted and his mood is depressed.  Patient did NOT attend evening group. Patient secluded in room throughout shift. No distress noted. A: Support and encouragement offered. Scheduled medications given to pt. 1:1 observation continued for patient safety. R: Patient receptive. Patient remains safe on the unit.

## 2015-04-29 LAB — FLUORESCENT TREPONEMAL AB(FTA)-IGG-BLD: Fluorescent Treponemal Ab, IgG: REACTIVE — AB

## 2015-04-29 MED ORDER — CHLORPROMAZINE HCL 25 MG/ML IJ SOLN
10.0000 mg | Freq: Once | INTRAMUSCULAR | Status: AC
  Administered 2015-04-29: 10 mg via INTRAMUSCULAR
  Filled 2015-04-29: qty 0.4

## 2015-04-29 MED ORDER — HALOPERIDOL 0.5 MG PO TABS
0.5000 mg | ORAL_TABLET | Freq: Three times a day (TID) | ORAL | Status: DC
  Administered 2015-04-29 – 2015-05-04 (×22): 0.5 mg via ORAL
  Filled 2015-04-29 (×22): qty 1

## 2015-04-29 MED ORDER — TRAZODONE HCL 100 MG PO TABS
100.0000 mg | ORAL_TABLET | Freq: Every day | ORAL | Status: DC
  Administered 2015-04-29 – 2015-05-11 (×13): 100 mg via ORAL
  Filled 2015-04-29 (×13): qty 1

## 2015-04-29 MED ORDER — OLANZAPINE 5 MG PO TBDP
20.0000 mg | ORAL_TABLET | Freq: Every day | ORAL | Status: DC | PRN
  Administered 2015-05-05 – 2015-05-10 (×2): 20 mg via ORAL
  Filled 2015-04-29 (×3): qty 4

## 2015-04-29 MED ORDER — DIPHENHYDRAMINE HCL 50 MG/ML IJ SOLN
25.0000 mg | Freq: Once | INTRAMUSCULAR | Status: AC
  Administered 2015-04-29: 25 mg via INTRAVENOUS
  Filled 2015-04-29: qty 1

## 2015-04-29 MED ORDER — LORAZEPAM 2 MG/ML IJ SOLN
2.0000 mg | Freq: Every day | INTRAMUSCULAR | Status: DC | PRN
  Administered 2015-05-05 – 2015-05-10 (×2): 2 mg via INTRAMUSCULAR
  Filled 2015-04-29 (×2): qty 1

## 2015-04-29 NOTE — Progress Notes (Signed)
Recreation Therapy Notes  Date: 08.12.16 Time: 3:00 pm Location: Craft Room  Group Topic: Self-expression  Goal Area(s) Addresses:  Patient will effectively use art as a means of self-expression. Patient will recognize positive benefit of self-expression. Patient will be able to identify one emotion experienced during group session. Patient will identify use of art/self-expression as a coping skill.  Behavioral Response: Did not attend  Intervention: Two Faces of Me  Activity: Patients were given a blank face worksheet and instructed to draw a line down the middle. On one side of the face they were told to draw or write how they felt when they were admitted. On the other side, they were told to draw or write how they want to feel when they are d/c.  Education: LRT educated patients on different forms of self-expression  Education Outcome: Patient did not attend group.  Clinical Observations/Feedback: Patient did not attend group.  Jacquelynn Cree, LRT/CTRS 04/29/2015 4:02 PM

## 2015-04-29 NOTE — Progress Notes (Signed)
Patient ambulating in hallways attempting to exit the unit.  Patient set off the exit alarm on the door.  Patient is not responsive to redirection.  MD called.  Orders given.

## 2015-04-29 NOTE — Progress Notes (Signed)
Received call from [place] at [facility] requesting that diagnosis be changed to "F" code diagnosis. Dr. Ardyth Harps made aware and advised this writer that the admission was a "bad" admission, and there is no  "F" code diagnosis because it is basically a social admission.

## 2015-04-29 NOTE — [facility] Group Notes (Signed)
[facility] Group Notes:  (Nursing/MHT/Case Management/Adjunct)  Date:  04/29/2015  Time:  1:17 PM  Type of Therapy:  Psychoeducational Skills  Participation Level:  Did Not Attend   Summary of Progress/Problems:  Alex Waters 04/29/2015, 1:17 PM

## 2015-04-29 NOTE — Progress Notes (Signed)
D:  Pt continues to have 1:1 sitter Pt stayed in bed for duration of the shift with the covers over his head. Pt woke up a few times , took his meds then went back to sleep. Pt could only nod and when he spoke it was incoharant and very soft.   A: Pt was offered support and encouragement. Pt was given scheduled medications.Q 15 minute checks were done for safety.   R: Pt is taking medication. Pt has no complaints at this time .Pt receptive to treatment and safety maintained on unit.

## 2015-04-29 NOTE — Progress Notes (Signed)
Patient appears mildly sedated but able to follow simple commands. He is disoriented except to name. Unable to assess mood, mental status. Speech is garbled with poverty of content. No evidence of aggression. Patient took medications without incident. He was able to transfer to chair with two person assist. Needs assistance with all ADLs and toileting. Braden scale low - skin care plan initiated and patient will be turned and positioned q 2 hours.  Continue current treatment plan, monitor mental status and behaviors. Reorient as tolerated. Maintain on 1:1 status for safety.

## 2015-04-29 NOTE — Plan of Care (Signed)
Problem: Ineffective individual coping Goal: STG: Patient will remain free from self harm Outcome: Progressing Pt safe on the unit at this time     
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
## 2015-04-29 NOTE — [facility] Group Notes (Signed)
[facility] LCSW Group Therapy  04/29/2015 4:00 PM  Type of Therapy:  Group Therapy  Participation Level:  Did Not Attend   Lulu Riding, MSW, LCSWA 04/29/2015, 4:00 PM

## 2015-04-29 NOTE — [facility] Group Notes (Signed)
[facility] LCSW Aftercare Discharge Planning Group Note   04/29/2015 10:01 AM  Participation Quality:  Did not attend.   Juwann Sherk L Shacoria Latif MSW, LCSWA  

## 2015-04-29 NOTE — Clinical Social Work Note (Signed)
CSW called guardian Alric Ran [phone] fax [phone] [place] [facility] to followup on finding placement for patient in memory care unity. Kathleene Hazel stated he had facility that wanted tosee patient when I spoke with him eysterday 04/28/15 and left voicemail to see if he could have facility to come out otherwise this Clinical research associate would search [place] area for Memory Care avaailbility. CSW applied for PASRR for Dementia Care unit placement and will continue to followup until PASARR number is received.

## 2015-04-29 NOTE — Progress Notes (Signed)
Patient sleeping with 1:1 sitter at bedside.  Respirations are even and unlabored.  No distress noted.

## 2015-04-30 ENCOUNTER — Encounter: Payer: Self-pay | Admitting: Psychiatry

## 2015-04-30 LAB — BASIC METABOLIC PANEL
Anion gap: 10 (ref 5–15)
BUN: 22 mg/dL — ABNORMAL HIGH (ref 6–20)
CO2: 28 mmol/L (ref 22–32)
Calcium: 9.4 mg/dL (ref 8.9–10.3)
Chloride: 104 mmol/L (ref 101–111)
Creatinine, Ser: 1.34 mg/dL — ABNORMAL HIGH (ref 0.61–1.24)
GFR calc non Af Amer: 55 mL/min — ABNORMAL LOW (ref >60)
GLUCOSE: 103 mg/dL — AB (ref 65–99)
Potassium: 3.8 mmol/L (ref 3.5–5.1)
Sodium: 142 mmol/L (ref 135–145)

## 2015-04-30 LAB — URINE CULTURE
Culture: 30000
Special Requests: NORMAL

## 2015-04-30 NOTE — Progress Notes (Signed)
Patient has remained in his room most of the day.  Quietly in bed.  Sitter at bedside for safety.  Pt was brought out for meals with wheelchair.  Affect flat.   Alert to person only.   Medication compliant.  Will cont to monitor for safety.

## 2015-04-30 NOTE — [facility] Group Notes (Signed)
[facility] LCSW Group Therapy  04/30/2015 2:06 PM  Type of Therapy: Group Therapy  Participation Level: Did Not Attend  Modes of Intervention: Discussion, Education, Socialization and Support  Summary of Progress/Problems: Pt will identify unhealthy thoughts and how they impact their emotions and behavior. Pt will be encouraged to discuss these thoughts, emotions and behaviors with the group.   Shagun Wordell L Keiarra Charon MSW, LCSWA  04/30/2015, 2:06 PM 
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
## 2015-04-30 NOTE — Progress Notes (Signed)
Pt was assisted to bedside camonde to urinate, pt diaper changed.

## 2015-05-01 MED ORDER — LEVETIRACETAM 100 MG/ML PO SOLN
1000.0000 mg | Freq: Two times a day (BID) | ORAL | Status: DC
  Administered 2015-05-01 – 2015-05-03 (×4): 1000 mg via ORAL
  Filled 2015-05-01 (×7): qty 10

## 2015-05-01 NOTE — Plan of Care (Signed)
Problem: Ineffective individual coping Goal: STG: Patient will remain free from self harm Outcome: Progressing Pt safe on the unit at this time   Problem: Aggression Towards others,Towards Self, and or Destruction Goal: STG-Patient will comply with prescribed medication regimen (Patient will comply with prescribed medication regimen)  Outcome: Progressing Pt taking medications

## 2015-05-01 NOTE — Progress Notes (Signed)
Patient is alert; oriented to name only. He was told he was in the hospital for treatment and was able to remember this briefly. His mood seems euthymic with blunted affect. No evidence of gross psychosis. Speech is limited, very soft but answers to simple questions appropriate. He is able to follow simple commands. Patient did eat adequate amount of meals and is taking fluids. There was a concern with patient's ability to swallow and a swallowing study was ordered by the physician. Patient did have difficulty swallowing large pills so they were crushed; he was able to swallow smaller pills without incident.  Patient up in wheelchair and spent time in the day area. He tolerated this well. No evidence of any aggressive behaviors. Continue current treatment, orient frequently. Monitor intake. Maintain on 1:1 for safety.
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
## 2015-05-01 NOTE — Progress Notes (Signed)
D:Pt is pleasant and cooperative. Pt continues to present the same, slow soft incoharent speech. Pt continues to be disorganized and disoriented .  A: Pt was offered support and encouragement. Pt was given scheduled medications. Pt was encourage to attend groups. Q 15 minute checks were done for safety.  R: Pt is taking medication. Pt has no complaints.Pt receptive to treatment and safety maintained on unit.

## 2015-05-01 NOTE — [facility] Group Notes (Signed)
[facility] LCSW Group Therapy  05/01/2015 4:30 PM  Type of Therapy:  Group Therapy  Participation Level:  Did Not Attend  Modes of Intervention:  Discussion, Education, Socialization and Support  Summary of Progress/Problems:  Rondall Allegra MSW, LCSWA  05/01/2015, 4:30 PM

## 2015-05-01 NOTE — Progress Notes (Signed)
Stayed isolated to room with sitter. Oriented to person only. Was medication compliant although was slow to swallow meds. Will suggest request for swallow study order from covering physician.

## 2015-05-02 MED ORDER — ENSURE ENLIVE PO LIQD
237.0000 mL | Freq: Three times a day (TID) | ORAL | Status: DC
  Administered 2015-05-02 – 2015-05-10 (×17): 237 mL via ORAL

## 2015-05-02 NOTE — Progress Notes (Signed)
Patient is calm and cooperative this shift.  Patient lying in bed sleep throughout the shift.  Patient went to dayroom for breakfast.  Patient met with Speech Therapist to evaluate swallowing.  Patent was placed on mechanical soft diet with he tolerates well.  Patient does require monitoring at meal times as he tends to hold food in his mouth.  He follows direction well.  Patient is medication compliant and takes his pill crushed in applesauce.  Patient continues with 1:1 sitter for safety and falls. Will continue to monitor

## 2015-05-02 NOTE — Progress Notes (Signed)
Recreation Therapy Notes  Date: 08.15.16 Time: 3:00 pm Location: Craft Room   Group Topic: Self-expression    Goal Area(s) Addresses:  Patient will use art as a means of self-expression. Patient will verbalize at least one emotion they are feeling today.  Behavioral Response: Did not attend  Intervention: Bottled Up  Activity: Patients were instructed to draw a bottle the way they saw themselves. Patients were instructed to write the emotions they were experiencing inside the bottle.  Education: LRT educated patients on different forms of self-expression.  Education Outcome: Patient did not attend group.  Clinical Observations/Feedback: Patient did not attend group.  Jacquelynn Cree, LRT/CTRS 05/02/2015 4:42 PM
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
## 2015-05-02 NOTE — [facility] Group Notes (Signed)
[facility] LCSW Group Therapy  05/02/2015 3:18 PM  Type of Therapy:  Group Therapy  Participation Level:  Did Not Attend    Lulu Riding, MSW, LCSWA 05/02/2015, 3:18 PM

## 2015-05-02 NOTE — Clinical Social Work Note (Signed)
CSW received call from [facility] [phone] of [place] [employer] and stated that [facility] [phone] wanted to visit patient but needed [facility] faxed. CSW faxed [phone] [facility] to [facility] and spoke with Leotis Shames point of contact who will schedule to visit patient tomorrow 05/03/15 in the after noon as she wants to meet patient then as notes state patient becomes more irritable in the later part of the day.
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
## 2015-05-03 MED ORDER — CLONAZEPAM 0.5 MG PO TABS
0.2500 mg | ORAL_TABLET | Freq: Two times a day (BID) | ORAL | Status: DC
  Administered 2015-05-03 – 2015-05-04 (×2): 0.25 mg via ORAL
  Filled 2015-05-03 (×2): qty 1

## 2015-05-03 MED ORDER — LEVETIRACETAM 500 MG PO TABS
1000.0000 mg | ORAL_TABLET | Freq: Two times a day (BID) | ORAL | Status: DC
  Administered 2015-05-03 – 2015-05-06 (×6): 1000 mg via ORAL
  Filled 2015-05-03 (×7): qty 2

## 2015-05-03 MED ORDER — FAMOTIDINE 40 MG PO TABS
40.0000 mg | ORAL_TABLET | Freq: Every day | ORAL | Status: DC
  Administered 2015-05-04 – 2015-05-12 (×9): 40 mg via ORAL
  Filled 2015-05-03 (×9): qty 1

## 2015-05-03 NOTE — Clinical Social Work Note (Signed)
CSW spoke with [facility] who was planning to visit patient today but due to office situation, staff will visit in the morning 05/04/15 instead. Leotis Shames is the point of contact at [facility] [phone]

## 2015-05-03 NOTE — Progress Notes (Signed)
Recreation Therapy Notes  Date: 08.16.16 Time: 3:00 pm Location: Craft Room  Group Topic: Goal Setting  Goal Area(s) Addresses:  Patient will write at least one goal. Patient will verbalize benefit of goal setting.  Behavioral Response: Appropriate  Intervention:  Step By Step  Activity: Patients were given a foot worksheet and instructed to list a goal inside the foot and positive words that will help them reach their goal on the outside of the foot.  Education: LRT educated patients on why goal setting is important.  Education Outcome: In group clarification offered  Clinical Observations/Feedback: Patient colored foot. Patient tried to answer questions during group. LRT could not understand what patient was trying to say.  Jacquelynn Cree, LRT/CTRS 05/03/2015 4:16 PM

## 2015-05-03 NOTE — Progress Notes (Signed)
Patient affect is apathetic and his mood is depressed.  Patient did NOT attend evening group. Patient remained in his room throughout the shift.  No distress noted. A: Support and encouragement offered. Scheduled medications given to pt. 1:1 observation continued for patient safety. R: Patient receptive. Patient remains safe on the unit.
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
## 2015-05-03 NOTE — Progress Notes (Signed)
D: patient remains to have a 1:1 sitter .   Affect pleasant on approach. Requested to watch TV with his peers . Patient remains to have some difficulties with swallowing food .  assisted with ADL's this am . Attended groups during shift . Continue to use wheel chair as means of moving about on the unit. Appetite fair at meals , no concerns around sleep  Affect pleasant on approach.  A.Instructed on medication given , verbalizing no understanding .  R: Patient voice no concerns .
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
## 2015-05-03 NOTE — Plan of Care (Signed)
Problem: Alteration in thought process Goal: LTG-Patient has not harmed self or others in at least 2 days Outcome: Progressing Appropriate  Behavior interacting with his peers

## 2015-05-04 DIAGNOSIS — A539 Syphilis, unspecified: Secondary | ICD-10-CM

## 2015-05-04 MED ORDER — HALOPERIDOL 0.5 MG PO TABS
0.5000 mg | ORAL_TABLET | Freq: Three times a day (TID) | ORAL | Status: DC
  Administered 2015-05-04 – 2015-05-12 (×23): 0.5 mg via ORAL
  Filled 2015-05-04 (×23): qty 1

## 2015-05-04 NOTE — Progress Notes (Signed)
Initial Nutrition Assessment     INTERVENTION:   Medical Food Supplement Therapy: continue Ensure Enlive po BID, each supplement provides 350 kcal and 20 grams of protein; recommend addition of Magic Cup as well for additional kcals/protein as pt reports he likes ice cream Other: recommend new weight as pt with poor oral intake and no weight since 8/9   NUTRITION DIAGNOSIS:   Inadequate oral intake related to lethargy/confusion, acute illness as evidenced by meal completion < 50%.  GOAL:   Patient will meet greater than or equal to 90% of their needs   MONITOR:    (Energy Intake, Anthropometrics, Digestive System)  REASON FOR ASSESSMENT:   LOS    ASSESSMENT:    Pt admitted with acute delerium and agitation with TBI dementia, hx of EtOH abuse; very difficult to understand, speech is slurred, speak quietly. Pt is disoriented to place, time and situation  Past Medical History  Diagnosis Date  . Renal disorder   . Hypertension   . Dementia      Diet Order:  DIET - DYS 1 Room service appropriate?: Yes; Fluid consistency:: Thin; SLP evaluated  Energy Intake: recorded po intake 31% on average; pt ate 50% at breakfast this AM. Per documentation, pt is drinking some EnsureEnlive  Electrolyte and Renal Profile:  Recent Labs Lab 04/28/15 1211 04/30/15 0633  BUN 27* 22*  CREATININE 1.54* 1.34*  NA 140 142  K 4.3 3.8   Meds: reviewed  Height:   Ht Readings from Last 1 Encounters:  04/26/15  (1.676 m)    Weight: no new weight since 8/9  Wt Readings from Last 1 Encounters:  04/26/15 128 lb (58.06 kg)    BMI:  Body mass index is 20.67 kg/(m^2).  Estimated Nutritional Needs:   Kcal:  1450-1740 kcals   Protein:  46-58 g (0.8-1.0 g/kg)   Fluid:  1450-1740 mL  MODERATE Care Level  Romelle Starcher MS, RD, LDN [phone] Pager

## 2015-05-04 NOTE — Progress Notes (Signed)
D: Patient  Remained  With sitter  Through out shift . Patient remains to have difficulties with sallowing  Food . Compliant with medication in apple sauce . Attending unit  Programming  Affect flat and depressed mood. Periods of wanting to watch  TV and sit in  With his peers A: Education on medication ,needs reinforcement and  Redirection.  R: Patient receptive  To nursing staff, remain calm , voice no other concerns .

## 2015-05-04 NOTE — [facility] Group Notes (Signed)
[facility] LCSW Group Therapy  05/04/2015 3:10 PM  Type of Therapy:  Group Therapy  Participation Level:  None  Patient attended group but did not participate even when asked direct questions. Patient has a dementia diagnosis and not able to participate.    Lulu Riding, MSW, LCSWA 05/04/2015, 3:10 PM
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
## 2015-05-04 NOTE — [facility] Group Notes (Signed)
[facility] Group Notes:  (Nursing/MHT/Case Management/Adjunct)  Date:  05/04/2015  Time:  4:08 AM  Type of Therapy:  Psychoeducational Skills  Participation Level:  Active  Participation Quality:  Attentive  Affect:  Appropriate  Cognitive:  Oriented  Insight:  Appropriate  Engagement in Group:  Developing/Improving  Modes of Intervention:  Education  Summary of Progress/Problems:  Jocelyn Lamer 05/04/2015, 4:08 AM

## 2015-05-04 NOTE — [facility] Group Notes (Signed)
[facility] Group Notes:  (Nursing/MHT/Case Management/Adjunct)  Date:  05/04/2015  Time:  4:13 PM  Type of Therapy:  Psychoeducational Skills  Participation Level:  Minimal  Participation Quality:  Appropriate and Drowsy  Affect:  Appropriate and Flat  Cognitive:  Appropriate  Insight:  Improving  Engagement in Group:  Engaged  Modes of Intervention:  Discussion, Education and Support  Summary of Progress/Problems:  Alex Waters Alex Waters 05/04/2015, 4:13 PM

## 2015-05-04 NOTE — Progress Notes (Signed)
Recreation Therapy Notes  Date: 08.17.16 Time: 3:00 pm Location: Craft Room  Group Topic: Self-esteem, Coping skills  Goal Area(s) Addresses:  Patient will identify positive attributes about self. Patient will identify at least one coping skill.  Behavioral Response: Did not attend  Intervention: All About Me  Activity: Patients were instructed to make a pamphlet including their life's motto, positive things about themselves, healthy coping skills, and their healthy support system   Education: LRT educated patients on ways they can increase their self-esteem.  Education Outcome: Patient did not attend group.  Clinical Observations/Feedback: Patient did not attend group.  Jacquelynn Cree, LRT/CTRS 05/04/2015 3:34 PM

## 2015-05-04 NOTE — [facility] Group Notes (Signed)
[facility] LCSW Aftercare Discharge Planning Group Note   05/04/2015 3:09 PM  Participation Quality:  Patient did not attend group   Lulu Riding, MSW, [employer]

## 2015-05-04 NOTE — Consult Note (Signed)
CC: confusion  HPI: Alex Waters is an 64 y.o. malewith history of dementia, alcohol abuse, possible traumatic brain injury who has been living at a group home in [place] with progressive dementia, agitation, confusion.   Testing for RPR is positive as well as FTA, consult for possible neurosyphilis.   Past Medical History  Diagnosis Date  . Renal disorder   . Hypertension   . Dementia     History reviewed. No pertinent past surgical history.  History reviewed. No pertinent family history.  Social History:  reports that he has quit smoking. His smoking use included Cigarettes. He has never used smokeless tobacco. He reports that he drinks alcohol. He reports that he does not use illicit drugs.  Allergies  Allergen Reactions  . Penicillins Other (See Comments)    Reaction: unknown    Medications: I have reviewed the patient's current medications.  ROS: Unable to provide any history  Physical Examination: Blood pressure 130/91, pulse 91, temperature 98.8 F (37.1 C), temperature source Oral, resp. rate 18, height  (1.676 m), weight 58.06 kg (128 lb), SpO2 97 %.  Pt is unable to tell me name, date, time But he is able to follow one step commands Generalized weakness but no focal abnormalities.    Laboratory Studies:   Basic Metabolic Panel:  Recent Labs Lab 04/28/15 1211 04/30/15 0633  NA 140 142  K 4.3 3.8  CL 105 104  CO2 29 28  GLUCOSE 108* 103*  BUN 27* 22*  CREATININE 1.54* 1.34*  CALCIUM 9.5 9.4    Liver Function Tests: No results for input(s): AST, ALT, ALKPHOS, BILITOT, PROT, ALBUMIN in the last 168 hours. No results for input(s): LIPASE, AMYLASE in the last 168 hours. No results for input(s): AMMONIA in the last 168 hours.  CBC: No results for input(s): WBC, NEUTROABS, HGB, HCT, MCV, PLT in the last 168 hours.  Cardiac Enzymes: No results for input(s): CKTOTAL, CKMB, CKMBINDEX, TROPONINI in the last 168 hours.  BNP: Invalid input(s):  POCBNP  CBG: No results for input(s): GLUCAP in the last 168 hours.  Microbiology: Results for orders placed or performed during the hospital encounter of 04/26/15  Urine culture     Status: None   Collection Time: 04/25/15 10:07 AM  Result Value Ref Range Status   Specimen Description URINE, CATHETERIZED  Final   Special Requests Normal  Final   Culture 30,000 COLONIES/mL VIRIDANS STREPTOCOCCUS  Final   Report Status 04/30/2015 FINAL  Final  Urine culture     Status: None   Collection Time: 04/26/15 12:05 PM  Result Value Ref Range Status   Specimen Description URINE, CLEAN CATCH  Final   Special Requests NONE  Final   Culture NO GROWTH 2 DAYS  Final   Report Status 04/28/2015 FINAL  Final    Coagulation Studies: No results for input(s): LABPROT, INR in the last 72 hours.  Urinalysis: No results for input(s): COLORURINE, LABSPEC, PHURINE, GLUCOSEU, HGBUR, BILIRUBINUR, KETONESUR, PROTEINUR, UROBILINOGEN, NITRITE, LEUKOCYTESUR in the last 168 hours.  Invalid input(s): APPERANCEUR  Lipid Panel:  No results found for: CHOL, TRIG, HDL, CHOLHDL, VLDL, LDLCALC  HgbA1C: No results found for: HGBA1C  Urine Drug Screen:     Component Value Date/Time   LABOPIA NONE DETECTED 04/25/2015 1007   LABOPIA NONE DETECTED 05/09/2009 1356   COCAINSCRNUR NONE DETECTED 05/09/2009 1356   LABBENZ POSITIVE* 04/25/2015 1007   LABBENZ NONE DETECTED 05/09/2009 1356   AMPHETMU NONE DETECTED 04/25/2015 1007   AMPHETMU  NONE DETECTED 05/09/2009 1356   THCU NONE DETECTED 04/25/2015 1007   THCU NONE DETECTED 05/09/2009 1356   LABBARB NONE DETECTED 04/25/2015 1007   LABBARB  05/09/2009 1356    NONE DETECTED        DRUG SCREEN FOR MEDICAL PURPOSES ONLY.  IF CONFIRMATION IS NEEDED FOR ANY PURPOSE, NOTIFY LAB WITHIN 5 DAYS.        LOWEST DETECTABLE LIMITS FOR URINE DRUG SCREEN Drug Class       Cutoff (ng/mL) Amphetamine      1000 Barbiturate      200 Benzodiazepine   200 Tricyclics        300 Opiates          300 Cocaine          300 THC              50    Alcohol Level: No results for input(s): ETH in the last 168 hours.   Imaging: No results found.   Assessment/Plan:  64 y.o. malewith history of dementia, alcohol abuse, possible traumatic brain injury who has been living at a group home in [place] with progressive dementia, agitation, confusion.   Testing for RPR is positive as well as FTA, consult for possible neurosyphilis.   There is a possibility of neurosyphilis.   LP and look for VDRL Appreciate consult by Dr. Sampson Goon  05/04/2015, 5:02 PM

## 2015-05-04 NOTE — Clinical Social Work Note (Signed)
CSW met with staff from [facility] and staff stated they could not take patient as patient with dementia but could not take him with syphilis diagnosis.

## 2015-05-04 NOTE — Progress Notes (Addendum)
D:   Patient mood remains depressed.  Patient did attend evening group. Patient visible on the milieu. No distress noted. A: Support and encouragement offered. Scheduled medications given to pt. 1:1 observation continued for patient safety. R: Patient receptive. Patient remains safe on the unit.
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
## 2015-05-04 NOTE — Plan of Care (Signed)
Problem: Alteration in thought process Goal: STG-Patient does not respond to command hallucinations Outcome: Progressing Patient denies auditory hallucinations

## 2015-05-05 NOTE — Progress Notes (Signed)
D: Patient affect is flat and his mood is depressed offers no conversation, will respond to verbal commands. Patient did NOT attend  group. His appetite remains poor. Patient remained in his room throughout the shift. No distress noted. A: Support and encouragement offered. Scheduled medications given to pt. 1:1 observation continued for patient safety. R: Patient receptive. Patient remains safe on the unit.

## 2015-05-05 NOTE — Plan of Care (Signed)
Problem: Ineffective individual coping Goal: STG: Patient will remain free from self harm Outcome: Progressing No self harm reported or observed     

## 2015-05-05 NOTE — Plan of Care (Signed)
Problem: Alteration in thought process Goal: STG-Patient is able to follow short directions Outcome: Progressing Pt compliant with directions
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
## 2015-05-05 NOTE — [facility] Group Notes (Signed)
[facility] Group Notes:  (Nursing/MHT/Case Management/Adjunct)  Date:  05/05/2015  Time:  1:45 PM  Type of Therapy:  Psychoeducational Skills  Participation Level:  Did Not Attend   Alex Waters 05/05/2015, 1:45 PM

## 2015-05-05 NOTE — Progress Notes (Signed)
Recreation Therapy Notes  Date: 08.18.16 Time: 3:00 pm Location: Craft Room  Group Topic: Leisure Education  Goal Area(s) Addresses:  Patient will identify activities for each letter of the alphabet. Patient will verbalize ability to integrate positive leisure into life post d/c. Patient will verbalize ability to use leisure as a coping mechanism.  Behavioral Response: Did not attend  Intervention: Leisure Alphabet  Activity: Patients were given a leisure alphabet worksheet and instructed to list healthy leisure activities for each letter of the alphabet.  Education: LRT educated patient on what is needed to participate in leisure  Education Outcome: Patient did not attend group.  Clinical Observations/Feedback: Patient did not attend group.  Jacquelynn Cree, LRT/CTRS 05/05/2015 3:53 PM

## 2015-05-05 NOTE — Clinical Social Work Note (Signed)
CSW referred patient to memory care facilities at [facility] [phone] and [facility] in [place] [phone] after [facility] refused patient due to neurosyphilis yesterday. [facility] requests updated note from MD stating patient "Dementia is primary and mental illness is secondary".
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
## 2015-05-05 NOTE — [facility] Group Notes (Signed)
[facility] LCSW Group Therapy  05/05/2015 3:19 PM  Type of Therapy:  Group Therapy  Participation Level:  Did Not Attend  Modes of Intervention:  Discussion, Education, Socialization and Support  Summary of Progress/Problems: Balance in life: Patients will discuss the concept of balance and how it looks and feels to be unbalanced. Pt will identify areas in their life that is unbalanced and ways to become more balanced.    Alex Waters L Makarios Madlock MSW, LCSWA  05/05/2015, 3:19 PM 

## 2015-05-05 NOTE — Progress Notes (Addendum)
D: Patient affect is flat and his mood is depressed.  Patient did NOT attend evening group.  His appetite is still poor. Patient remained in his room throughout the shift.  No distress noted. A: Support and encouragement offered. Scheduled medications given to pt. 1:1 observation continued for patient safety. R: Patient receptive. Patient remains safe on the unit.

## 2015-05-05 NOTE — [facility] Group Notes (Signed)
[facility] Group Notes:  (Nursing/MHT/Case Management/Adjunct)  Date:  05/05/2015  Time:  9:26 AM  Type of Therapy:Go Setting  Participation Level:  Did Not Attend  Participation Quality:  Did Not Attend  Mayra Neer 05/05/2015, 9:26 AM

## 2015-05-05 NOTE — Progress Notes (Signed)
D: Pt responds to verbal commands . Pt is pleasant and cooperative. Pt continues to stay most of his time in his bed and room.   A: Pt was offered support and encouragement. Pt was given scheduled medications. Pt was encourage to attend groups. Q 15 minute checks were done for safety.   R: Pt is taking medication. Pt has no complaints at this time .Pt receptive to treatment and safety maintained on unit.

## 2015-05-06 ENCOUNTER — Inpatient Hospital Stay: Payer: Medicaid Other

## 2015-05-06 DIAGNOSIS — N189 Chronic kidney disease, unspecified: Secondary | ICD-10-CM

## 2015-05-06 LAB — CSF CELL COUNT WITH DIFFERENTIAL
Eosinophils, CSF: 0 %
Lymphs, CSF: 79 %
Monocyte-Macrophage-Spinal Fluid: 0 %
Other Cells, CSF: 0
RBC COUNT CSF: 1959 /mm3 — AB (ref 0–3)
SEGMENTED NEUTROPHILS-CSF: 21 %
Tube #: 1
WBC CSF: 8 /mm3

## 2015-05-06 LAB — CBC
HCT: 39.5 % — ABNORMAL LOW (ref 40.0–52.0)
Hemoglobin: 12.9 g/dL — ABNORMAL LOW (ref 13.0–18.0)
MCH: 30 pg (ref 26.0–34.0)
MCHC: 32.8 g/dL (ref 32.0–36.0)
MCV: 91.7 fL (ref 80.0–100.0)
Platelets: 192 10*3/uL (ref 150–440)
RBC: 4.31 MIL/uL — AB (ref 4.40–5.90)
RDW: 13.7 % (ref 11.5–14.5)
WBC: 6.6 10*3/uL (ref 3.8–10.6)

## 2015-05-06 LAB — BASIC METABOLIC PANEL
Anion gap: 8 (ref 5–15)
BUN: 23 mg/dL — ABNORMAL HIGH (ref 6–20)
CO2: 28 mmol/L (ref 22–32)
CREATININE: 1.38 mg/dL — AB (ref 0.61–1.24)
Calcium: 9.7 mg/dL (ref 8.9–10.3)
Chloride: 106 mmol/L (ref 101–111)
GFR calc Af Amer: >60 mL/min (ref >60)
GFR, EST NON AFRICAN AMERICAN: 53 mL/min — AB (ref >60)
GLUCOSE: 98 mg/dL (ref 65–99)
POTASSIUM: 4.3 mmol/L (ref 3.5–5.1)
SODIUM: 142 mmol/L (ref 135–145)

## 2015-05-06 LAB — PROTEIN, CSF: Total  Protein, CSF: 82 mg/dL — ABNORMAL HIGH (ref 15–45)

## 2015-05-06 LAB — GLUCOSE, CSF: GLUCOSE CSF: 60 mg/dL (ref 40–70)

## 2015-05-06 MED ORDER — LORAZEPAM 2 MG/ML IJ SOLN
2.0000 mg | Freq: Once | INTRAMUSCULAR | Status: AC
  Administered 2015-05-06: 2 mg via INTRAMUSCULAR
  Filled 2015-05-06: qty 1

## 2015-05-06 MED ORDER — NYSTATIN 100000 UNIT/ML MT SUSP
5.0000 mL | Freq: Four times a day (QID) | OROMUCOSAL | Status: DC
  Administered 2015-05-06 – 2015-05-12 (×25): 500000 [IU] via ORAL
  Filled 2015-05-06 (×39): qty 5

## 2015-05-06 NOTE — Progress Notes (Signed)
Patient moved from stretcher to bed. No c/o headache or pain. Band-Aid intact to lower back. No drainage noted. HOB elevated 30 degrees. Continue to monitor.
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
## 2015-05-06 NOTE — Progress Notes (Signed)
Patient has remained with 1:1 safety sitter due to generalized weakness and confusion. He had lumbar puncture this afternoon and returned around 1445. He is lying flat on stretcher for two hours per post LP instructions. Speech is slowed but he follows instructions. His tongue is noted to be coated with yeast and may be causing his swallowing problems. MD notified and orders obtained. Affect is flat. There is no evidence of psychosis. Will continue to monitor.

## 2015-05-06 NOTE — Progress Notes (Signed)
Recreation Therapy Notes  Date: 08.19.16 Time: 3:00 pm Location: Craft Room  Group Topic: Problem solving, communication, teamwork  Goal Area(s) Addresses:  Patient will work in teams towards shared goal. Patient will verbalize skills needed to make activity successful. Patient will verbalize benefit of using skills identified to reach post d/c goals.  Behavioral Response: Did not attend   Intervention: Landing Pad  Activity: Patients were divided into two teams and given 15 straws and approximately 2.5 feet of tape. Patients  instructed to build a landing pad to catch a golf ball that would be dropped from approximately 4 feet.  Education: LRT educated patients on why communication, problem solving, and teamwork is important.  Education Outcome: Patient did not attend group.   Clinical Observations/Feedback: Patient did not attend group.  Jacquelynn Cree, LRT/CTRS 05/06/2015 4:48 PM

## 2015-05-06 NOTE — Clinical Social Work Note (Signed)
CSW met with patient and Cristal of [facility] who has bed and will let CSW know. CSW called Crystal [phone] to see if her staff was able to make a decision and staff stated decision will be made beginning of next week.

## 2015-05-06 NOTE — [facility] Group Notes (Signed)
[facility] LCSW Aftercare Discharge Planning Group Note   05/06/2015 11:24 AM  Participation Quality:  Did not attend.   Renee Beale L Kailany Dinunzio MSW, LCSWA  
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
## 2015-05-06 NOTE — [facility] Group Notes (Signed)
[facility] Group Notes:  (Nursing/MHT/Case Management/Adjunct)  Date:  05/06/2015  Time:  12:19 PM  Type of Therapy: Participation Level:Did not attend   Participation Quality:   Summary of Progress/Problems:  Mayra Neer 05/06/2015, 12:19 PM

## 2015-05-07 MED ORDER — FLUCONAZOLE 100 MG PO TABS
100.0000 mg | ORAL_TABLET | Freq: Every day | ORAL | Status: DC
  Administered 2015-05-08 – 2015-05-12 (×5): 100 mg via ORAL
  Filled 2015-05-07 (×6): qty 1

## 2015-05-07 MED ORDER — LEVETIRACETAM 100 MG/ML PO SOLN
1000.0000 mg | Freq: Two times a day (BID) | ORAL | Status: DC
  Administered 2015-05-07 – 2015-05-12 (×11): 1000 mg via ORAL
  Filled 2015-05-07 (×13): qty 10

## 2015-05-07 MED ORDER — FLUCONAZOLE 40 MG/ML PO SUSR
200.0000 mg | Freq: Once | ORAL | Status: AC
  Administered 2015-05-07: 200 mg via ORAL
  Filled 2015-05-07: qty 5

## 2015-05-07 NOTE — Progress Notes (Signed)
D: Pt is awake in bed this evening. Pt mood is depressed and his affect is flat/blunted. Pt is aphasic but does follow simple instructions. He denies pain but is still disoriented to situation, place and time. Lumbar band aid is intact w/o drainage.   A: Writer administered medications crushed in apple sauce, and pt was able to swallow effectively. Writer also placed pillow between ankles and knees and instructed staff to turn q2 hrs to prevent pressure sores. HOB at 20 degrees.   R: Pt is able to get up from bed independently but needs one person assist for fall prevention. Pt needs some redirection to prevent wandering as the pt becomes confused, disoriented and impulsive. Pt remained in room throughout the evening and has slept intermittently.

## 2015-05-07 NOTE — Progress Notes (Signed)
Patient has been in bed most of the day; he ate breakfast late and does not want lunch now. Speech is slurred and difficult to understand, but he is verbalizing requests and following commands. Denies SI/HI/AVH. Taking meds but still has difficulty swallowing due to thrush. Will continue to monitor.

## 2015-05-07 NOTE — [facility] Group Notes (Signed)
[facility] LCSW Group Therapy  05/07/2015 3:57 PM  Type of Therapy:  Group Therapy  Participation Level:  Did Not Attend  Modes of Intervention:  Discussion, Education, Socialization and Support  Summary of Progress/Problems:Pt will identify unhealthy thoughts and how they impact their emotions and behavior. Pt will be encouraged to discuss these thoughts, emotions and behaviors with the group.   Kenishia Plack L Travonna Swindle MSW, LCSWA  05/07/2015, 3:57 PM 

## 2015-05-07 NOTE — Plan of Care (Signed)
Problem: Problem: Skin/Wound Progression Goal: ADEQUATE MOBILITY PROGRESSION Outcome: Progressing Pt is able get in/out of bed independently to use restroom, but still needs one person assist to prevent falls.
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
## 2015-05-08 NOTE — Progress Notes (Signed)
Patient showered with assistance this morning and has been up much of the day. Wearing jeans and a sweatshirt. Has been eating meals with minimal assistance. Thrush to his mouth seems slightly improved. Affect is flat and mood is reserved. He responds appropriately but still has slurred speech. Denies SI/HI/AVH. Will continue to monitor.

## 2015-05-08 NOTE — Progress Notes (Signed)
D: Pt is awake in bed this evening with blanket pulled over his head. Pt is responding to verbal stimuli, although he is disoriented X3. Pt is more alert and responds verbally to staff, at times. Pt mood and affect are appropriate to the situation.  A: Writer provided emotional support and administered medications as prescribed, crushed in apple sauce. Writer instructed sitter to maintain a pillow between knees and ankles as well.  R: Pt is pleasant and cooperative with staff today. Pt is able to swish and swallow nystatin as instructed, and he is up ad lib to the restroom, with assistance. 1:1 sitter is ongoing for safety.

## 2015-05-08 NOTE — [facility] Group Notes (Signed)
[facility] Group Notes:  (Nursing/MHT/Case Management/Adjunct)  Date:  05/08/2015  Time:  12:02 AM  Type of Therapy:  Group Therapy  Participation Level:  Did Not Attend  Participation Quality:  N/A  Affect:  N/A  Cognitive:  N/A  Insight:  None  Engagement in Group:  None  Modes of Intervention:  N/A  Summary of Progress/Problems:  Alex Waters 05/08/2015, 12:02 AM

## 2015-05-08 NOTE — [facility] Group Notes (Signed)
[facility] LCSW Group Therapy  05/08/2015 2:19 PM  Type of Therapy:  Group Therapy  Participation Level:  Did Not Attend  Modes of Intervention:  Discussion, Education, Socialization and Support  Summary of Progress/Problems: Todays topic: Grudges  Patients will be encouraged to discuss their thoughts, feelings, and behaviors as to why one holds on to grudges and reasons why people have grudges. Patients will process the impact of grudges on their daily lives and identify thoughts and feelings related to holding grudges. Patients will identify feelings and thoughts related to what life would look like without grudges.   [employer] MSW, LCSWA  05/08/2015, 2:19 PM

## 2015-05-08 NOTE — Progress Notes (Signed)
Pt remains with sitter for safety. Stays in his bed with covers over his head. Taking fluids well. Med compliant.Denies any complaints.
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
## 2015-05-09 LAB — CSF CULTURE: CULTURE: NO GROWTH

## 2015-05-09 LAB — CSF CULTURE W GRAM STAIN: Gram Stain: NONE SEEN

## 2015-05-09 LAB — VDRL, CSF: VDRL Quant, CSF: NONREACTIVE

## 2015-05-09 MED ORDER — DOXYCYCLINE HYCLATE 100 MG PO TABS
100.0000 mg | ORAL_TABLET | Freq: Two times a day (BID) | ORAL | Status: DC
  Administered 2015-05-09 – 2015-05-12 (×7): 100 mg via ORAL
  Filled 2015-05-09 (×8): qty 1
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
## 2015-05-09 NOTE — Plan of Care (Signed)
Problem: Aggression Towards others,Towards Self, and or Destruction Goal: LTG - No aggression,physical/verbal/destruction prior to D/C (Patient will have no episodes of physical or verbal aggression or property destruction towards self or others for _____ day (s) prior to discharge.)  Outcome: Progressing No aggressive behavior noted     

## 2015-05-09 NOTE — Progress Notes (Signed)
ID E Note Reviewed LP results- bloodly tap so wbc and protein slightly elevated but VDRL negative. No evidence of neurosyphilis. 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

## 2015-05-09 NOTE — Progress Notes (Signed)
D:  Patient remains on 1:1 . Patient cooperative, tremors noted with body. Appetite poor. Medication crushed and given with apple sauce. Affect flat and depressed. Patient resting in bed this am  No unit participation . Limited interaction with peers or just sitting in dayroom . Patient remains to have difficulties  With swallowing remains to have a white covered tongue. A: Staff assisted to dayroom to watch TV, Patient verbalizing only needs.Encourage patient to verbalize feelings . R: Voice no other concerns . Staff remain at bedside

## 2015-05-09 NOTE — Clinical Social Work Note (Signed)
CSW called Natalia Leatherwood at [facility] [phone] to check on decision on a bed for patient. Raiford Noble stated that he has sent it to his corporate office and is awaiting a response from them before offering a bed.   CSW then called [facility] [phone] and got a voicemail leaving a message for staff to call back.   Called [facility] [phone] who has bed available and faxed patient FL2 and MAR.  CSW called [facility] [phone] and faxed FL2 and MAR.

## 2015-05-09 NOTE — Progress Notes (Signed)
Recreation Therapy Notes  Date: 08.22.16 Time: 3:00 pm Location: Craft Room  Group Topic: Self-expression  Goal Area(s) Addresses:  Patient will identify one color per emotion listed on the wheel. Patient will verbalize benefit of using art as a means of self-expression. Patient will verbalize one emotion experienced during session. Patient will be educated on other forms of self-expression.  Behavioral Response: Did not attend  Intervention:  Emotion Wheel  Activity: Patients were given an Arboriculturist with seven emotions. Patients were instructed to pick a color for each emotion and color it in.  Education: LRT educated patients on different forms of self-expression.  Education Outcome: Patient did not attend group.   Clinical Observations/Feedback: Patient did not attend group.  Jacquelynn Cree, LRT/CTRS 05/09/2015 4:22 PM

## 2015-05-09 NOTE — Progress Notes (Signed)
Initial Nutrition Assessment     INTERVENTION:   Medical Food Supplement Therapy: recommend continuing Ensure Enlive po TID, each supplement provides 350 kcal and 20 grams of protein Nutrition related medication management: pt may benefit from addition of appetite stimulant as well as MVI Coordination of Care: recommend new weight as pt with poor po intake, no weight since 8/9  NUTRITION DIAGNOSIS:   Inadequate oral intake related to lethargy/confusion, acute illness as evidenced by meal completion < 50%. Continues but being addressed via supplements  GOAL:   Patient will meet greater than or equal to 90% of their needs  MONITOR:    (Energy Intake, Anthropometrics, Digestive System)  ASSESSMENT:    Pt remains disoriented x 3, pt with dementia at baseline; pt with oral candidiasis noted today, started on treatment  Diet Order:  DIET - DYS 1 Room service appropriate?: Yes; Fluid consistency:: Thin   Energy Intake: recorded po intake 30% of meals on average. Pt ate 0% at lunch today; but noted intake was improved yesterday with 90%. Per documentation, pt appears to be taking some Ensure; although not all 3 every day.   Skin:  Reviewed, no issues  Height:   Ht Readings from Last 1 Encounters:  04/26/15  (1.676 m)    Weight: no new weight since 8/9  Wt Readings from Last 1 Encounters:  04/26/15 128 lb (58.06 kg)    Meds: diflucan  BMI:  Body mass index is 20.67 kg/(m^2).  Estimated Nutritional Needs:   Kcal:  1450-1740 kcals   Protein:  46-58 g (0.8-1.0 g/kg)   Fluid:  1450-1740 mL  MODERATE Care Level  Romelle Starcher MS, RD, LDN [phone] Pager

## 2015-05-10 NOTE — Progress Notes (Signed)
D: Patient remains to have a 1:1 this shift . Affect flat with Depressed  Mood . Patient continues to use wheelchair as means to mover about on unit . Appropriate ADL'S with staff assist this am Patient noted limited interaction with his  Peers and staff . Patient only verbalizing his request no social conversation . No unit programming this shift . Patient  Carried  To the dayroom  To watch TV. Patient meals  noted fair this shift.  A: Instructions on medications continue  to reinstruct . Encourage  Verbalization of needs R: Receptive to information compliant with medications given

## 2015-05-10 NOTE — Progress Notes (Signed)
Recreation Therapy Notes  Date: 08.23.16 Time: 3:00 pm Location: Craft Room  Group Topic: Goal Setting  Goal Area(s) Addresses:  Patient will write at least one goal. Patient will write at least one obstacle.  Behavioral Response: Did not attend   Intervention: Recovery Goal Chart  Activity: Patients were instructed to make a Recovery Goal Chart including goals, and obstacles.   Education: LRT educated patients on healthy ways to celebrate meeting their goals.   Education Outcome: Patient did not attend group.  Clinical Observations/Feedback: Patient did not attend group.  Jacquelynn Cree, LRT/CTRS 05/10/2015 4:01 PM
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
## 2015-05-10 NOTE — [facility] Group Notes (Signed)
[facility] Group Notes:  (Nursing/MHT/Case Management/Adjunct)  Date:  05/10/2015  Time:  3:08 PM  Type of Therapy:  Psychoeducational Skills  Participation Level:  Did Not Attend    Alex Waters 05/10/2015, 3:08 PM

## 2015-05-10 NOTE — Plan of Care (Signed)
Problem: Aggression Towards others,Towards Self, and or Destruction Goal: STG-Patient will comply with prescribed medication regimen (Patient will comply with prescribed medication regimen)  Outcome: Progressing Compliant with medication

## 2015-05-10 NOTE — Progress Notes (Addendum)
D: Patient affect is flat and his mood is depressed  Patient did NOT attend evening group. Patient stayed in his room throughout the shift.. No distress noted. A: Support and encouragement offered. Scheduled medications given to pt. 1:1 observation continued for patient safety. R: Patient receptive. Patient remains safe on the unit.

## 2015-05-11 DIAGNOSIS — B370 Candidal stomatitis: Secondary | ICD-10-CM

## 2015-05-11 MED ORDER — HALOPERIDOL 0.5 MG PO TABS: 0.5000 mg | ORAL_TABLET | Freq: Three times a day (TID) | ORAL | Status: AC

## 2015-05-11 MED ORDER — OLANZAPINE 20 MG PO TBDP: 20.0000 mg | ORAL_TABLET | Freq: Every day | ORAL | Status: AC | PRN

## 2015-05-11 MED ORDER — FLUCONAZOLE 100 MG PO TABS: 100.0000 mg | ORAL_TABLET | Freq: Every day | ORAL | Status: AC

## 2015-05-11 MED ORDER — FAMOTIDINE 40 MG PO TABS: 40.0000 mg | ORAL_TABLET | Freq: Every day | ORAL | Status: AC

## 2015-05-11 MED ORDER — LEVETIRACETAM 500 MG PO TABS: 1000.0000 mg | ORAL_TABLET | Freq: Two times a day (BID) | ORAL | Status: AC

## 2015-05-11 MED ORDER — DOXYCYCLINE HYCLATE 100 MG PO TABS: 100.0000 mg | ORAL_TABLET | Freq: Two times a day (BID) | ORAL | Status: AC

## 2015-05-11 MED ORDER — TRAZODONE HCL 100 MG PO TABS: 100.0000 mg | ORAL_TABLET | Freq: Every day | ORAL | Status: AC

## 2015-05-11 MED ORDER — LATANOPROST 0.005 % OP SOLN: 1.0000 [drp] | Freq: Every day | OPHTHALMIC | Status: AC

## 2015-05-11 MED ORDER — ENSURE ENLIVE PO LIQD: 237.0000 mL | Freq: Three times a day (TID) | ORAL | Status: AC

## 2015-05-11 NOTE — Progress Notes (Signed)
Recreation Therapy Notes  Date: 08.24.16 Time: 3:00 pm Location: Craft Room  Group Topic: Self-esteem  Goal Area(s) Addresses:  Patient will write down at least one positive trait about self. Patient will verbalize importance of self-esteem  Behavioral Response: Did not attend  Intervention: I Am  Activity: Patients were given a worksheet with the letter I and instructed to fill it with as many positive traits about themselves as they could.  Education: LRT educated patients on ways to increase their self-esteem.   Education Outcome: Patient did not attend group.  Clinical Observations/Feedback: Patient did not attend group.  Jacquelynn Cree, LRT/CTRS 05/11/2015 4:55 PM

## 2015-05-11 NOTE — Plan of Care (Signed)
Problem: Ineffective individual coping Goal: LTG: Patient will report a decrease in negative feelings Outcome: Not Progressing Pt is still presenting with a flat affect and a depressed mood.
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
## 2015-05-11 NOTE — Progress Notes (Addendum)
D: Pt ate meals in the day room and has been groomed and in his clothes.  Pt still presenting with a flat and depressed affect. Pt had meeting with memory care from [facility] to interview pt, pt was very interactive with the interviewers.  Pt denies suicidal ideation, and homicidal ideation.  Potential d/c tomorrow.  A: encouraged patient to notify staff of needs, staff encouraged fluids, staff using concert statements for cognition. R: Pt is receptive to information, pt is out in the day room and eating meals

## 2015-05-11 NOTE — Progress Notes (Addendum)
D: Patient affect and mood are depresed  Patient did NOT attend evening group. Patient isolative and remained in his room throughout the shift. Patient had 4 impulsive episodes where he quickly jumped out of bed and ambulated to the restroom.  However he was unable to urinate at those times. No distress noted. A: Support and encouragement offered. Scheduled medications given to pt. 1:1 observation  continued for patient safety. R: Patient receptive. Patient remains safe on the unit.

## 2015-05-11 NOTE — [facility] Group Notes (Signed)
[facility] Group Notes:  (Nursing/MHT/Case Management/Adjunct)  Date:  05/11/2015  Time:  11:35 AM  Type of Therapy:  Psychoeducational Skills  Participation Level:  Did Not Attend   Marquette Old 05/11/2015, 11:35 AM

## 2015-05-11 NOTE — [facility] Group Notes (Signed)
[facility] LCSW Aftercare Discharge Planning Group Note   05/11/2015 12:55 PM  Participation Quality:  Did not attend.   Topher Buenaventura L Dorris Pierre MSW, LCSWA     

## 2015-05-12 DIAGNOSIS — R1310 Dysphagia, unspecified: Secondary | ICD-10-CM

## 2015-05-12 DIAGNOSIS — K000 Anodontia: Secondary | ICD-10-CM

## 2015-05-12 DIAGNOSIS — K08109 Complete loss of teeth, unspecified cause, unspecified class: Secondary | ICD-10-CM

## 2015-05-12 NOTE — Clinical Social Work Note (Signed)
CSW called [facility] [phone] and they were able to get signed documents from guardian [facility] and bed is ready but need transportation for patient. Lauren at [facility] in [place] said her transportation personnel will call this Clinical research associate to arrange pick up.

## 2015-05-12 NOTE — Plan of Care (Signed)
Problem: Ineffective individual coping Goal: LTG: Patient will report a decrease in negative feelings Outcome: Not Progressing Pt is unable to express feelings about self harm, patient is still presenting with a flat affect and depressed mood.  Will continue to encourage and monitor.

## 2015-05-12 NOTE — Clinical Social Work Note (Signed)
CSW spoke with Leotis Shames of [facility] in [place], [place] [phone] and she has a bed for patient if guardian will sign documents. CSW gave Lauren contact info for [employer] DSS guardian [phone]. CSW called DeCarlos and notified him that documents were to be faxed to him and signed.

## 2015-05-12 NOTE — Progress Notes (Signed)
Recreation Therapy Notes  Date: 08.25.16 Time: 3:00 pm Location: Craft Room  Group Topic: Leisure Education  Goal Area(s) Addresses:  Patient will identify things they are grateful for. Patient will identify how being grateful can influence decision making.  Behavioral Response: Attentive  Intervention: Lexicographer  Activity: Patients were given a I Am Grateful For worksheet and instructed to list specific things they are grateful for under each category.   Education: LRT educated patients on why it is important to be grateful.  Education Outcome: In group clarification offered   Clinical Observations/Feedback: Patient colored on worksheet. Patient did not contribute to group discussion.  Jacquelynn Cree, LRT/CTRS 05/12/2015 4:15 PM

## 2015-05-12 NOTE — Progress Notes (Signed)
D: Patient has been sleeping all day, appetite seems to be well.  Patient energy level is low.  Unable to assess suicidal ideation and auditory or visual hallucinations.  Not having any pain at this time.  Patient is wearing scrubs.  Patients affect is flat.     A: Encouraged patient to attend more groups and interact more with others.  As well as try and ambulate more.  Went over care plan and education with patient again.   R: Patient unreceptive of information received from staff.  Unable to assess weather patient will follow thru with instructions and teaching.

## 2015-05-12 NOTE — Progress Notes (Addendum)
D:   Patient affect and mood are depressed.  Patient did NOT attend evening group. Patient remained in his room throughout the shift. No distress noted. A: Support and encouragement offered. Scheduled medications given to pt.  1:1 observation continued for patient safety. R: Patient receptive. Patient remains safe on the unit.

## 2015-05-12 NOTE — [facility] Group Notes (Signed)
[facility] LCSW Group Therapy  05/12/2015 4:36 PM  Type of Therapy:  Group Therapy  Participation Level:  Did Not Attend  Modes of Intervention:  Discussion, Education, Socialization and Support  Summary of Progress/Problems: Balance in life: Patients will discuss the concept of balance and how it looks and feels to be unbalanced. Pt will identify areas in their life that is unbalanced and ways to become more balanced.    Alex Waters L Ariyanah Aguado MSW, LCSWA  05/12/2015, 4:36 PM  

## 2015-05-12 NOTE — Progress Notes (Signed)
  [facility] Adult Case Management Discharge Plan :  Will you be returning to the same living situation after discharge:  No. Patient can not return to group home but has arrangements in an assisted living facility memory care unit at [facility] in [place] [phone] At discharge, do you have transportation home?: Yes,  [facility] has arranged pickup Do you have the ability to pay for your medications: Yes,  patient has Medicaid  Release of information consent forms completed and in the chart;  Patient's signature needed at discharge.  Patient to Follow up at: Follow-up Information    Follow up with [facility].   Why:  For follow-up care, [facility] will contact patient for followup care once DISCHARGE SUMMARY is received    Contact information:   [address] [place], [place] [address] Ph [phone] Fax [phone]       Follow up with [facility].   Why:  For follow-up care PT assessment, facility will contact patient after discharge.   Contact information:   [address] [place], [place] [phone] Fax [phone]      Patient denies SI/HI: Yes,  patient denies SI/HI    Safety Planning and Suicide Prevention discussed: Yes,  SPE discussed with patient and his guardian  Have you used any form of tobacco in the last 30 days? (Cigarettes, Smokeless Tobacco, Cigars, and/or Pipes): No  Has patient been referred to the Quitline?: N/A patient is not a smoker  Lulu Riding, MSW, LCSWA 05/12/2015, 2:53 PM
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
## 2015-05-12 NOTE — Discharge Instructions (Signed)
-  Physical Therapy for unsteady gait  -Rolling walker with 5" wheels  -Diet dysph 1  -All medicationss crushed and given with apple  sauce

## 2015-05-12 NOTE — [facility] Suicide Risk Assessment (Signed)
[facility] INPATIENT:  Family/Significant Other Suicide Prevention Education  Suicide Prevention Education:  Education Completed; Alric Ran (DSS Guardian) [phone] has been identified by the patient as the family member/significant other with whom the patient will be residing, and identified as the person(s) who will aid the patient in the event of a mental health crisis (suicidal ideations/suicide attempt).  With written consent from the patient, the family member/significant other has been provided the following suicide prevention education, prior to the and/or following the discharge of the patient.  The suicide prevention education provided includes the following:  Suicide risk factors  Suicide prevention and interventions  National Suicide Hotline telephone number  [facility] assessment telephone number  [facility] Emergency Assistance 911  [facility] and/or Residential Mobile Crisis Unit telephone number  Request made of family/significant other to:  Remove weapons (e.g., guns, rifles, knives), all items previously/currently identified as safety concern.    Remove drugs/medications (over-the-counter, prescriptions, illicit drugs), all items previously/currently identified as a safety concern.  The family member/significant other verbalizes understanding of the suicide prevention education information provided.  The family member/significant other agrees to remove the items of safety concern listed above.  Lulu Riding, MSW, LCSWA 05/12/2015, 2:52 PM

## 2015-05-12 NOTE — Plan of Care (Signed)
Problem: Alteration in thought process Goal: LTG-Patient verbalizes understanding importance med regimen (Patient verbalizes understanding of importance of medication regimen and need to continue outpatient care.)  Outcome: Not Progressing Pt still resists medication administration by attempting to cheek his meds.

## 2015-05-12 NOTE — [facility] Suicide Risk Assessment (Signed)
[facility] Discharge Suicide Risk Assessment   Demographic Factors:  Male  Total Time spent with patient: 30 minutes   Psychiatric Specialty Exam: Physical Exam  ROS  Blood pressure 124/78, pulse 125, temperature 98.2 F (36.8 C), temperature source Oral, resp. rate 20, height  (1.676 m), weight 58.06 kg (128 lb), SpO2 98 %.Body mass index is 20.67 kg/(m^2).                                                       Have you used any form of tobacco in the last 30 days? (Cigarettes, Smokeless Tobacco, Cigars, and/or Pipes): No  Has this patient used any form of tobacco in the last 30 days? (Cigarettes, Smokeless Tobacco, Cigars, and/or Pipes) Yes, A prescription for an FDA-approved tobacco cessation medication was offered at discharge and the patient refused  Mental Status Per Nursing Assessment::   On Admission:  NA  Current Mental Status by Physician: no evidence of psychosis or agitation.  Calm and pleasant  Loss Factors: Decline in physical health  Historical Factors: NA  Risk Reduction Factors:   Living with another person, especially a relative  Continued Clinical Symptoms:  Medical Diagnoses and Treatments/Surgeries  Cognitive Features That Contribute To Risk:  Loss of executive function    Suicide Risk:  Minimal: No identifiable suicidal ideation.  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 Oral candidiasis [B37.0] 05/11/2015  . Chronic renal failure [N18.9] 05/06/2015  . 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 Tobacco use disorder [Z72.0] 04/27/2015  . HTN (hypertension) [I10] 04/26/2015  . GERD (gastroesophageal reflux disease)  [K21.9] 04/26/2015  . TBI (traumatic brain injury) [S06.9X0A] 04/26/2015  . Alcohol use disorder, severe, in sustained remission, in controlled environment [F10.99] 04/26/2015  . 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 Geannie Risen. Go on 05/16/2015.   Why:  For follow-up care appointment Monday 05/16/15 at 2:00pm   Contact information:   [address] [place], [place] [address] Phone: [phone]                                 Fax: [phone]       Is patient on multiple antipsychotic therapies at discharge:  Yes,   Do you recommend tapering to monotherapy for antipsychotics?  second antipsychotic should only be used as a PRN in case of severe agitation   Has Patient had three or more failed trials of antipsychotic monotherapy by history:  No  Recommended Plan for Multiple Antipsychotic Therapies: NA    Jimmy Footman 05/12/2015, 9:22 AM

## 2015-05-12 NOTE — Progress Notes (Signed)
Discharge note  Pt was d/c to a memory care facility in [place].  Pt left with no SI or Hi and no auditory or hallucinations noted.   A: all personal items in locker returned to patient, instructions given on discharge information, received prescriptions and follow up appointment.    R: unable to assess weather patient will comply with outpatient services and medications as prescribed

## 2015-05-12 NOTE — [facility] Group Notes (Signed)
[facility] Group Notes:  (Nursing/MHT/Case Management/Adjunct)  Date:  05/12/2015  Time:  8:37 AM  Type of Therapy:  Group Therapy  Participation Level:  Did Not Attend  Summary of Progress/Problems:  Illias Pantano De'Chelle Reace Breshears 05/12/2015, 8:37 AM

## 2015-05-13 NOTE — Progress Notes (Signed)
AVS H&P Discharge Summary faxed to [facility] Kathryne Sharper) for hospital follow-up

## 2015-06-19 ENCOUNTER — Encounter: Payer: Self-pay | Admitting: Infectious Diseases

## 2022-01-24 ENCOUNTER — Non-Acute Institutional Stay: Payer: Medicare Other | Admitting: Internal Medicine

## 2022-01-24 VITALS — BP 132/94 | HR 58 | Temp 97.9°F | Resp 18 | Wt 161.0 lb

## 2022-01-24 DIAGNOSIS — F209 Schizophrenia, unspecified: Secondary | ICD-10-CM

## 2022-01-24 DIAGNOSIS — S069X9S Unspecified intracranial injury with loss of consciousness of unspecified duration, sequela: Secondary | ICD-10-CM

## 2022-01-24 DIAGNOSIS — F02818 Dementia in other diseases classified elsewhere, unspecified severity, with other behavioral disturbance: Secondary | ICD-10-CM

## 2022-01-24 DIAGNOSIS — Z515 Encounter for palliative care: Secondary | ICD-10-CM

## 2022-01-24 DIAGNOSIS — A539 Syphilis, unspecified: Secondary | ICD-10-CM

## 2022-02-02 NOTE — Progress Notes (Signed)
[facility] Follow-Up Visit Telephone: [phone]  Fax: [phone]   Date of encounter: 01/24/22 3:01 PM PATIENT NAME: Alex Waters [place] [address]   [phone] (home) [phone] (work) DOB: March 07, 1951 MRN: [id number] PRIMARY CARE PROVIDER:    Moss Mc  REFERRING PROVIDER:   Moss Mc  RESPONSIBLE PARTY:    Contact Information     Name Relation Home Work Mobile   [place] Other [phone]          I met face to face with patient and family in [facility]. Palliative Care was asked to follow this patient by consultation request of  Moss Mc to address advance care planning and complex medical decision making. This is follow-up visit.                                     ASSESSMENT AND PLAN / RECOMMENDATIONS:   Advance Care Planning/Goals of Care: Goals include to maximize quality of life and symptom management. Patient/health care surrogate gave his/her permission to discuss.Our advance care planning conversation included a discussion about:    The value and importance of advance care planning  Experiences with loved ones who have been seriously ill or have died  Exploration of personal, cultural or spiritual beliefs that might influence medical decisions  Exploration of goals of care in the event of a sudden injury or illness  Identification  of a healthcare agent  Review and updating or creation of an  advance directive document . Decision not to resuscitate or to de-escalate disease focused treatments due to poor prognosis. CODE STATUS:  FULL CODE-- has guardian Alex Waters [phone] per chart  Symptom Management/Plan: 1. 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 -continue memory care support  2. Latent Syphilis (+RPR and FTA,  -CSF VDRL) -prevnously addressed by primary team   3. Traumatic brain injury with loss of consciousness, sequela ([place]) -cause of dementia; continue mood stabilizers, anxiolytics   4. Schizophrenia, unspecified type ([place]) -longstanding per chart, continue anxiolytics and mood stabilizers, oddly not on an antipsychotic for this (maybe b/c of dyskinesias)  5. Palliative care by specialist -no recent seizures; cont  current regimen there Uses manual wheelchair for mobility -edentulous and has dysphagia related to this -weight down today, but had been up prior to this  -continue to follow   Follow up Palliative Care Visit: Palliative care will continue to follow for complex medical decision making, advance care planning, and clarification of goals. Return 12 weeks or prn.  This visit was coded based on medical decision making (MDM).  PPS: 40%  HOSPICE ELIGIBILITY/DIAGNOSIS: TBD  Chief Complaint: Follow-up palliative visit  HISTORY OF PRESENT ILLNESS:  Alex Waters is a 71 y.o. year old male  with dementia, TBI, seizures, schizophrenia, htn, glaucoma, dyskinesias fo mouth, anxiety and insomnia seen in palliative care follow up.   Nursing has no new concerns about him.  He also does not have complaints.  History obtained from review of EMR, discussion with primary team, and interview with family, facility staff/caregiver and/or Mr. Schaumburg.  I reviewed available labs, medications, imaging, studies and related documents from the EMR.  Records reviewed and summarized above.   ROS  General: NAD EYES: denies vision changes ENMT: has dysphagia Cardiovascular: denies  chest pain, denies DOE Pulmonary: denies cough, denies increased SOB Abdomen: endorses good appetite, denies constipation, endorses incontinence of bowel GU: denies dysuria, endorses incontinence of urine MSK:  denies increased weakness,  no falls reported Skin: denies rashes or wounds Neurological: denies pain, has  insomnia on tx Psych: Endorses positive mood, anxiety on tx Heme/lymph/immuno: denies bruises, abnormal bleeding  Physical Exam: Current and past weights:  161 lbs 4/26 down from 168 lbs but was 157 lbs in January of this year so oscillating Constitutional: NAD General: WNWD EYES: anicteric sclera, lids intact, no discharge  ENMT: intact hearing, oral mucous membranes moist, edentulous CV: S1S2, RRR, no LE edema Pulmonary: LCTA, no increased work of breathing, no cough, room air Abdomen: intake 75%, normo-active BS + 4 quadrants, soft and non tender, no ascites GU: deferred MSK: sarcopenia, moves all extremities, uses manual wheelchair Skin: warm and dry, no rashes or wounds on visible skin Neuro:   generalized weakness,  \ cognitive impairment Psych: non-anxious affect, A and O x to self Hem/lymph/immuno: no widespread bruising  CURRENT PROBLEM LIST:  Patient Active Problem List   Diagnosis Date Noted   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 teeth 05/12/2015   Dysphagia 05/12/2015   Oral candidiasis 05/11/2015   Chronic renal failure 05/06/2015   Latent Syphilis (+RPR and FTA, -CSF VDRL) 05/04/2015   Tobacco use disorder 04/27/2015   GERD (gastroesophageal reflux disease) 04/26/2015   TBI (traumatic brain injury) ([place]) 04/26/2015   Alcohol use disorder, severe, in sustained remission, in controlled environment ([place]) 04/26/2015   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 (dementia primary diagnosis) 04/26/2015   PAST MEDICAL HISTORY:  Active Ambulatory Problems    Diagnosis Date Noted   GERD (gastroesophageal reflux disease) 04/26/2015   TBI (traumatic brain injury) ([place]) 04/26/2015   Alcohol use disorder, severe, in sustained remission, in controlled environment ([place]) 04/26/2015   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 (dementia primary diagnosis) 04/26/2015   Tobacco use disorder 04/27/2015   Latent Syphilis  (+RPR and FTA, -CSF VDRL) 05/04/2015   Chronic renal failure 05/06/2015   Oral candidiasis 05/11/2015   No natural teeth 05/12/2015   Dysphagia 05/12/2015   Resolved Ambulatory Problems    Diagnosis Date Noted   Acute delirium 04/26/2015   Past Medical History:  Diagnosis Date   Dementia ([place])    Hypertension    Renal disorder    SOCIAL HX:  Social History   Tobacco Use   Smoking status: Former    Types: Cigarettes   Smokeless tobacco: Never  Substance Use Topics   Alcohol use: Yes    Comment: rarely     ALLERGIES:  Allergies  Allergen Reactions   Penicillins Other (See Comments)    Reaction: unknown     PERTINENT MEDICATIONS:  Outpatient Encounter Medications as of 01/24/2022  Medication Sig   doxycycline (VIBRA-TABS) 100 MG tablet Take 1 tablet (100 mg total) by mouth every 12 (twelve) hours.   famotidine (PEPCID) 40 MG tablet Take 1 tablet (40 mg total) by mouth daily.   feeding supplement, ENSURE ENLIVE, (ENSURE ENLIVE) LIQD Take 237 mLs by mouth 3 (three) times daily between meals.   fluconazole (DIFLUCAN) 100 MG tablet Take 1 tablet (100 mg total) by mouth daily.   haloperidol (HALDOL) 0.5 MG tablet Take 1 tablet (0.5 mg total) by mouth 3 (three) times daily.   latanoprost (XALATAN) 0.005 % ophthalmic solution Place 1 drop into both eyes at bedtime.   levETIRAcetam (KEPPRA)  500 MG tablet Take 2 tablets (1,000 mg total) by mouth 2 (two) times daily.   OLANZapine zydis (ZYPREXA) 20 MG disintegrating tablet Take 1 tablet (20 mg total) by mouth daily as needed (agitation).   traZODone (DESYREL) 100 MG tablet Take 1 tablet (100 mg total) by mouth at bedtime.   No facility-administered encounter medications on file as of 01/24/2022.   Thank you for the opportunity to participate in the care of Mr. Attar.  The palliative care team will continue to follow. Please call our office at [phone] if we can be of additional assistance.   Hollace Kinnier, DO  COVID-19  PATIENT SCREENING TOOL Asked and negative response unless otherwise noted:  Have you had symptoms of covid, tested positive or been in contact with someone with symptoms/positive test in the past 5-10 days? no

## 2022-02-07 ENCOUNTER — Encounter: Payer: Self-pay | Admitting: Internal Medicine

## 2022-05-30 ENCOUNTER — Non-Acute Institutional Stay: Payer: Medicare Other | Admitting: Internal Medicine

## 2022-05-30 VITALS — BP 122/78 | HR 60 | Temp 97.1°F | Resp 16 | Wt 159.0 lb

## 2022-05-30 DIAGNOSIS — F209 Schizophrenia, unspecified: Secondary | ICD-10-CM

## 2022-05-30 DIAGNOSIS — A539 Syphilis, unspecified: Secondary | ICD-10-CM

## 2022-05-30 DIAGNOSIS — F02818 Dementia in other diseases classified elsewhere, unspecified severity, with other behavioral disturbance: Secondary | ICD-10-CM

## 2022-05-30 DIAGNOSIS — Z515 Encounter for palliative care: Secondary | ICD-10-CM

## 2022-05-30 DIAGNOSIS — S069X9S Unspecified intracranial injury with loss of consciousness of unspecified duration, sequela: Secondary | ICD-10-CM

## 2022-05-30 NOTE — Progress Notes (Signed)
[facility] Follow-Up Visit Telephone: [phone]  Fax: [phone]   Date of encounter: 05/30/22 9:39 AM PATIENT NAME: Alex Waters [place] [address]   [phone] (home) [phone] (work) DOB: 1951/04/08 MRN: [id number] PRIMARY CARE PROVIDER:    Moss Waters  REFERRING PROVIDER:   Moss Waters  RESPONSIBLE PARTY:    Contact Information     Name Relation Home Work Mobile   Alex Waters Other [phone]          I met face to face with patient and family in [facility]. Palliative Care was asked to follow this patient by consultation request of  Alex Waters to address advance care planning and complex medical decision making. This is follow-up visit.                                     ASSESSMENT AND PLAN / RECOMMENDATIONS:   Advance Care Planning/Goals of Care: Goals include to maximize quality of life and symptom management. Patient/health care surrogate gave his/her permission to discuss.Our advance care planning conversation included a discussion about:    The value and importance of advance care planning  Experiences with loved ones who have been seriously ill or have died  Exploration of personal, cultural or spiritual beliefs that might influence medical decisions  Exploration of goals of care in the event of a sudden injury or illness  Identification  of a healthcare agent  Review and updating or creation of an  advance directive document . Decision not to resuscitate or to de-escalate disease focused treatments due to poor prognosis. CODE STATUS:  FULL CODE-- has guardian Alex Waters [phone] per chart  Symptom Management/Plan: 1. 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 -continue memory care support and full adl support  2. Latent  Syphilis (+RPR and FTA, -CSF VDRL) -previously treated by primary team   3. Traumatic brain injury with loss of consciousness, sequela ([place]) -cause of dementia; continue mood stabilizers, anxiolytics   4. Schizophrenia, unspecified type ([place]) -longstanding per chart, continue anxiolytics and mood stabilizers  5. Palliative care by specialist -no recent concerns from staff -no recent seizures; cont  current regimen there Uses manual wheelchair for mobility -edentulous and has dysphagia related to this -weight down today, but had been up prior to this  -continue to follow   Follow up Palliative Care Visit: Palliative care will continue to follow for complex medical decision making, advance care planning, and clarification of goals. Return 12 weeks or prn.  This visit was coded based on medical decision making (MDM).  PPS: 40%  HOSPICE ELIGIBILITY/DIAGNOSIS: TBD  Chief Complaint: Follow-up palliative visit  HISTORY OF PRESENT ILLNESS:  Alex Waters is a 71 y.o. year old male  with dementia, TBI, seizures, schizophrenia, htn, glaucoma, dyskinesias fo mouth, anxiety and insomnia seen in palliative care follow up.   Nursing has no new concerns about him.  He also does not have complaints.  History obtained from review of EMR, discussion with primary team, and interview with family, facility staff/caregiver and/or Alex Waters.  I reviewed available labs, medications, imaging, studies and related documents from the EMR.  Records reviewed and summarized above.   ROS  General: NAD EYES: denies vision changes ENMT: has dysphagia Cardiovascular: denies chest pain,  denies DOE Pulmonary: denies cough, denies increased SOB Abdomen: endorses good appetite, denies constipation, endorses incontinence of bowel GU: denies dysuria, endorses incontinence of urine MSK:  denies increased weakness,  no falls reported Skin: denies rashes or wounds Neurological: denies pain, has insomnia on  tx Psych: Endorses positive mood, anxiety on tx Heme/lymph/immuno: denies bruises, abnormal bleeding  Physical Exam: Current and past weights:  159 lbs down from 161 lbs 4/26 down from 168 lbs but was 157 lbs in January of this year so oscillating Constitutional: NAD General: WNWD EYES: anicteric sclera, lids intact, no discharge  ENMT: intact hearing, oral mucous membranes moist, edentulous CV: S1S2, RRR, no LE edema Pulmonary: LCTA, no increased work of breathing, no cough, room air Abdomen: intake 75%, normo-active BS + 4 quadrants, soft and non tender, no ascites GU: deferred MSK: sarcopenia, moves all extremities, uses manual wheelchair Skin: warm and dry, no rashes or wounds on visible skin Neuro:   generalized weakness,  \ cognitive impairment Psych: non-anxious affect, A and O x to self Hem/lymph/immuno: no widespread bruising  CURRENT PROBLEM LIST:  Patient Active Problem List   Diagnosis Date Noted   No natural teeth 05/12/2015   Dysphagia 05/12/2015   Oral candidiasis 05/11/2015   Chronic renal failure 05/06/2015   Latent Syphilis (+RPR and FTA, -CSF VDRL) 05/04/2015   Tobacco use disorder 04/27/2015   GERD (gastroesophageal reflux disease) 04/26/2015   TBI (traumatic brain injury) ([place]) 04/26/2015   Alcohol use disorder, severe, in sustained remission, in controlled environment ([place]) 04/26/2015   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 (dementia primary diagnosis) 04/26/2015   PAST MEDICAL HISTORY:  Active Ambulatory Problems    Diagnosis Date Noted   GERD (gastroesophageal reflux disease) 04/26/2015   TBI (traumatic brain injury) ([place]) 04/26/2015   Alcohol use disorder, severe, in sustained remission, in controlled environment ([place]) 04/26/2015   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 (dementia primary diagnosis) 04/26/2015   Tobacco use disorder 04/27/2015   Latent  Syphilis (+RPR and FTA, -CSF VDRL) 05/04/2015   Chronic renal failure 05/06/2015   Oral candidiasis 05/11/2015   No natural teeth 05/12/2015   Dysphagia 05/12/2015   Resolved Ambulatory Problems    Diagnosis Date Noted   Acute delirium 04/26/2015   Past Medical History:  Diagnosis Date   Dementia ([place])    Hypertension    Renal disorder    SOCIAL HX:  Social History   Tobacco Use   Smoking status: Former    Types: Cigarettes   Smokeless tobacco: Never  Substance Use Topics   Alcohol use: Yes    Comment: rarely     ALLERGIES:  Allergies  Allergen Reactions   Penicillins Other (See Comments)    Reaction: unknown     PERTINENT MEDICATIONS:  Outpatient Encounter Medications as of 05/30/2022  Medication Sig   doxycycline (VIBRA-TABS) 100 MG tablet Take 1 tablet (100 mg total) by mouth every 12 (twelve) hours.   famotidine (PEPCID) 40 MG tablet Take 1 tablet (40 mg total) by mouth daily.   feeding supplement, ENSURE ENLIVE, (ENSURE ENLIVE) LIQD Take 237 mLs by mouth 3 (three) times daily between meals.   fluconazole (DIFLUCAN) 100 MG tablet Take 1 tablet (100 mg total) by mouth daily.   haloperidol (HALDOL) 0.5 MG tablet Take 1 tablet (0.5 mg total) by mouth 3 (three) times daily.   latanoprost (XALATAN) 0.005 % ophthalmic solution Place 1 drop into both eyes at bedtime.  levETIRAcetam (KEPPRA) 500 MG tablet Take 2 tablets (1,000 mg total) by mouth 2 (two) times daily.   OLANZapine zydis (ZYPREXA) 20 MG disintegrating tablet Take 1 tablet (20 mg total) by mouth daily as needed (agitation).   traZODone (DESYREL) 100 MG tablet Take 1 tablet (100 mg total) by mouth at bedtime.   No facility-administered encounter medications on file as of 05/30/2022.   Thank you for the opportunity to participate in the care of Mr. Villamar.  The palliative care team will continue to follow. Please call our office at [phone] if we can be of additional assistance.   Hollace Kinnier,  DO  COVID-19 PATIENT SCREENING TOOL Asked and negative response unless otherwise noted:  Have you had symptoms of covid, tested positive or been in contact with someone with symptoms/positive test in the past 5-10 days? no

## 2022-06-13 ENCOUNTER — Encounter: Payer: Self-pay | Admitting: Internal Medicine
